# Patient Record
Sex: Male | Born: 1960 | Race: White | Hispanic: No | Marital: Married
Health system: Midwestern US, Academic
[De-identification: ages and names within clinical notes are randomized; demographics above are authoritative.]

## PROBLEM LIST (undated history)

## (undated) DIAGNOSIS — M545 Low back pain: ICD-10-CM

---

## 2016-09-22 ENCOUNTER — Encounter: Admit: 2016-09-22 | Discharge: 2016-09-22

## 2016-09-22 ENCOUNTER — Ambulatory Visit: Admit: 2016-09-22 | Discharge: 2016-09-23

## 2016-09-22 DIAGNOSIS — E119 Type 2 diabetes mellitus without complications: ICD-10-CM

## 2016-09-22 DIAGNOSIS — E785 Hyperlipidemia, unspecified: ICD-10-CM

## 2016-09-22 DIAGNOSIS — R69 Illness, unspecified: Principal | ICD-10-CM

## 2016-09-22 DIAGNOSIS — I10 Essential (primary) hypertension: Principal | ICD-10-CM

## 2016-09-22 MED ORDER — HYDROCODONE-ACETAMINOPHEN 10-325 MG PO TAB
1 | ORAL_TABLET | ORAL | 0 refills | 15.00000 days | Status: AC | PRN
Start: 2016-09-22 — End: 2016-09-22

## 2016-09-22 MED ORDER — BACLOFEN 10 MG PO TAB
10 mg | ORAL_TABLET | Freq: Two times a day (BID) | ORAL | 3 refills | Status: AC
Start: 2016-09-22 — End: 2016-10-07

## 2016-09-22 MED ORDER — HYDROCODONE-ACETAMINOPHEN 10-325 MG PO TAB
1 | ORAL_TABLET | ORAL | 0 refills | 30.00000 days | Status: AC | PRN
Start: 2016-09-22 — End: 2016-12-06

## 2016-09-22 MED ORDER — GABAPENTIN 800 MG PO TAB
800 mg | ORAL_TABLET | ORAL | 3 refills | Status: AC
Start: 2016-09-22 — End: 2016-10-07

## 2016-09-22 NOTE — Progress Notes
SPINE CENTER HISTORY AND PHYSICAL    Chief Complaint   Patient presents with   ??? Spine - Pain       Subjective     HISTORY OF PRESENT ILLNESS:   Ray Walls is 56 y.o. male who is kindly referred by Dr Felix Pacini at [facility].  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  He has been seen for pain management since 2017 status post L4-5 hemilaminectomy in 2015. The pain began without incident. He has received multiple rounds of transforaminal epidural steroid injections with good relief but limited duration.    Patient describes the pain as the following:  - Constant  - Localized to left sided back, buttocks, posterior thigh, and left lower leg behind the calf..  - Described as aching, numb-like, stabbing, tiring, nagging, shooting, burning, penetrating, throbbing, sharp, gnawing, and unbearable   - Pain is made better by: Rest, medications, heat pads  - Pain is made worse by: Walking, standing, extending back.  - Pain does radiate into the left leg and occasionally the right thigh.  - Reports numbness, weakness, and tingling in the left leg into the calf  - Patient denies bowel or bladder dysfunction.    - Pain is worse at night, he reports trouble sleeping at night as well.  - Pain score averages at 4-9/10 on VAS     Prior interventional pain management strategies: 5-6 TFESI at L3-4, and at L5-S1 - helps for about 2 weeks each time  Prior surgeries related to patient's pain: L4-5 Hemilaminectomy and bone spur removal    Patient is currently on the following modalities for pain management:  NSAIDs  Acetaminophen  Hydrocodone 5mg   Gabapentin  Voltaren gel  TENS unit        Past Medical History:   Diagnosis Date   ??? DM (diabetes mellitus) (HCC)    ??? Hyperlipidemia    ??? Hypertension    ??? Morbid obesity (HCC)        Past Surgical History:   Procedure Laterality Date   ??? LAMINECTOMY      Hemilaminectomy at L4-5       family history includes Heart Failure in his mother; Stroke in his father. Social History     Social History   ??? Marital status: Married     Spouse name: N/A   ??? Number of children: N/A   ??? Years of education: N/A     Occupational History   ???  [employer]     Social History Main Topics   ??? Smoking status: Never Smoker   ??? Smokeless tobacco: Never Used   ??? Alcohol use No   ??? Drug use: No   ??? Sexual activity: Not on file     Other Topics Concern   ??? Not on file     Social History Narrative   ??? No narrative on file       Allergies   Allergen Reactions   ??? Pcn [Penicillins] ANAPHYLAXIS       Current Outpatient Prescriptions on File Prior to Visit   Medication Sig Dispense Refill   ??? aspirin EC 81 mg tablet Take 81 mg by mouth daily.     ??? Blood-Glucose Meter (ONE TOUCH ULTRAMINI) kit Use to check blood glucose. 2 Each 0   ??? fenofibrate nanocrystallized (TRICOR) 48 mg tablet Take 48 mg by mouth daily.     ??? GLUC SU/CHONDRO SU A/VIT C/MN (GLUCOSAMINE 1500 COMPLEX  PO) Take 1 Tab by mouth daily.     ??? ibuprofen (MOTRIN) 200 mg tablet Take 400 mg by mouth twice daily as needed.     ??? insulin aspart (NOVOLOG) 100 unit/mL flexPEN 12 units plus correction; up to 50 U/day 3 box 3   ??? insulin glargine (LANTUS SOLOSTAR) 100 unit/mL (3 mL) injection PEN 44 U q hs 3 box 3   ??? ketoconazole (NIZORAL) 2 % topical cream Apply to affected area twice daily. Apply to both feet twice a day. 1 Container 1   ??? losartan/hydrochlorothiazide (HYZAAR)  50/12.5 mg tablet 1 Tab Take 1 Tab by mouth daily.     ??? metoprolol XL (TOPROL XL) 50 mg tablet Take 75 mg by mouth daily.     ??? oxyCODone-acetaminophen (PERCOCET) 5-325 mg tablet Take 1-2 Tabs by mouth every 4 hours as needed for Pain Earliest Fill Date: 01/31/12 Max 12 tabs/day 40 Tab 0   ??? senna (SENOKOT) 8.6 mg tablet Take 2 Tabs by mouth twice daily. 90 Tab    ??? sildenafil(+) (VIAGRA) 50 mg tablet Take 1 Tab by mouth as Needed for Erectile dysfunction. 10 Tab 0     No current facility-administered medications on file prior to visit.        Vitals: 09/22/16 0913   BP: 151/76   Pulse: 105   Resp: 15   SpO2: 99%   Weight: 122.9 kg (271 lb)   Height: 182.9 cm (72)       Oswestry Total Score:: 52    No Data Recorded  Is a controlled substance agreement on file?No    Pain Score: Nine    Body mass index is 36.75 kg/m???.    Review of Systems   Musculoskeletal: Positive for back pain.   All other systems reviewed and are negative.      PHYSICAL EXAM:   General: Alert, cooperative, no acute distress.  HEENT: Normocephalic, atraumatic.  Neck: Supple.  Lungs: Unlabored respirations, bilateral and equal chest excursion.  Heart: Regular rate by palpation of pulse.  Skin: Warm and dry to touch.  Abdomen: Nondistended, obese.    Musculoskeletal: Mild lumbar tenderness. POSITIVE tenderness of the LEFT SI joints. PAIN with lumbar extension. FABER negative bilaterally.    Neurological: Strength 5/5 in the bilateral lower extremities. Sensation to light touch is intact and equal in the bilateral lower extremities. Reflexes are 2+ in bilateral patellar and achilles tendons. Straight leg raise is POSITIVE ON LEFT.         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    IMPRESSION:    1. Failed back surgical syndrome    2. Lumbosacral radiculopathy    3. Facet arthropathy (HCC)    4. Sacroiliitis (HCC)          PLAN:   Ray Walls is a 55 y.o. male who presents for   Chief Complaint   Patient presents with   ??? Spine - Pain       1.  Lifestyle modification:  Recommend activity as tolerated. Encourage physical therapy and home exercise plan  2.  Medication: Will refill Norco 10/325 BID, Gabapentin 800mg  TID, and baclofen 10mg  BID  3.  Therapy: Refer to psychology for evaluation for  SCS trial 4.  Interventions: Pending psychologist evaluation, will schedule SCS trial with [employer] at next available appointment.  5.  Follow-up: 2 months    I was present for key elements of the history and physical and agree with the above note.  Thank you for the opportunity to participate in the care of Ray Walls, please do not hesitate to contact me with questions.

## 2016-09-23 DIAGNOSIS — M5417 Radiculopathy, lumbosacral region: ICD-10-CM

## 2016-09-23 DIAGNOSIS — M961 Postlaminectomy syndrome, not elsewhere classified: Principal | ICD-10-CM

## 2016-10-07 ENCOUNTER — Ambulatory Visit: Admit: 2016-10-07 | Discharge: 2016-10-08

## 2016-10-07 ENCOUNTER — Encounter: Admit: 2016-10-07 | Discharge: 2016-10-07

## 2016-10-07 DIAGNOSIS — E785 Hyperlipidemia, unspecified: ICD-10-CM

## 2016-10-07 DIAGNOSIS — M5417 Radiculopathy, lumbosacral region: Secondary | ICD-10-CM

## 2016-10-07 DIAGNOSIS — E119 Type 2 diabetes mellitus without complications: ICD-10-CM

## 2016-10-07 DIAGNOSIS — M5416 Radiculopathy, lumbar region: Principal | ICD-10-CM

## 2016-10-07 DIAGNOSIS — I10 Essential (primary) hypertension: Principal | ICD-10-CM

## 2016-10-07 MED ORDER — TRIAMCINOLONE ACETONIDE 40 MG/ML IJ SUSP
80 mg | Freq: Once | EPIDURAL | 0 refills | Status: CP
Start: 2016-10-07 — End: ?
  Administered 2016-10-07: 21:00:00 80 mg via EPIDURAL

## 2016-10-07 MED ORDER — GABAPENTIN 800 MG PO TAB
800 mg | ORAL_TABLET | ORAL | 3 refills | Status: AC
Start: 2016-10-07 — End: 2017-09-05

## 2016-10-07 MED ORDER — VOLTAREN 1 % TP GEL
Freq: Four times a day (QID) | TOPICAL | 5 refills | 30.00000 days | Status: AC
Start: 2016-10-07 — End: 2017-02-24

## 2016-10-07 MED ORDER — FENTANYL CITRATE (PF) 50 MCG/ML IJ SOLN
50 ug | Freq: Once | INTRAVENOUS | 0 refills | Status: CP
Start: 2016-10-07 — End: ?
  Administered 2016-10-07: 21:00:00 50 ug via INTRAVENOUS

## 2016-10-07 MED ORDER — IOPAMIDOL 41 % IT SOLN
2.5 mL | Freq: Once | EPIDURAL | 0 refills | Status: CP
Start: 2016-10-07 — End: ?
  Administered 2016-10-07: 21:00:00 2.5 mL via EPIDURAL

## 2016-10-07 MED ORDER — MIDAZOLAM 1 MG/ML IJ SOLN
2 mg | Freq: Once | INTRAVENOUS | 0 refills | Status: CP
Start: 2016-10-07 — End: ?
  Administered 2016-10-07: 21:00:00 2 mg via INTRAVENOUS

## 2016-10-07 MED ORDER — BACLOFEN 10 MG PO TAB
10 mg | ORAL_TABLET | Freq: Two times a day (BID) | ORAL | 3 refills | Status: AC
Start: 2016-10-07 — End: 2017-02-24

## 2016-10-07 NOTE — Progress Notes
SPINE CENTER  INTERVENTIONAL PAIN PROCEDURE HISTORY AND PHYSICAL    Chief Complaint   Patient presents with   ??? Lower Back - Pain       HISTORY OF PRESENT ILLNESS:  Bilateral lumbar pain, Mr. Jezewski last injection in [place] resulted in reduction of pain by more than 50% for 2-3 months.    Past Medical History:   Diagnosis Date   ??? DM (diabetes mellitus) (HCC)    ??? Hyperlipidemia    ??? Hypertension    ??? Morbid obesity (HCC)        Past Surgical History:   Procedure Laterality Date   ??? LAMINECTOMY      Hemilaminectomy at L4-5       family history includes Heart Failure in his mother; Stroke in his father.    Social History     Social History   ??? Marital status: Married     Spouse name: N/A   ??? Number of children: N/A   ??? Years of education: N/A     Occupational History   ???  [employer]     Social History Main Topics   ??? Smoking status: Never Smoker   ??? Smokeless tobacco: Never Used   ??? Alcohol use No   ??? Drug use: No   ??? Sexual activity: Not on file     Other Topics Concern   ??? Not on file     Social History Narrative   ??? No narrative on file       Allergies   Allergen Reactions   ??? Pcn [Penicillins] ANAPHYLAXIS       Vitals:    10/07/16 1444   BP: 127/73   Pulse: 93   Resp: 18   Temp: 36.8 ???C (98.3 ???F)   TempSrc: Oral   SpO2: 99%   Weight: 118.4 kg (261 lb)   Height: 182.9 cm (72)       REVIEW OF SYSTEMS: 10 point ROS obtained and negative except for back and leg pain.      PHYSICAL EXAM:  General: Alert and oriented, very pleasant male.   HEENT showed extraocular muscles were intact and no other abnormalities.  Unlabored breathing.  Regular rate and rhythm on CV exam.   5/5 strength in bilateral upper and lower extremities.    Sensation is intact to light touch and equal in the upper and lower extremities.  Bilateral lumbar tenderness to palpation        IMPRESSION:    1. Failed back surgical syndrome    2. Lumbosacral radiculopathy PLAN: Lumbar Transforaminal Steroid Injection at bilateral L5-S1    General Pre Procedural Sedation ASA Classification      I have discussed risks and alternatives of this type of sedation and procedure with: patient    NPO Status:Acceptable    Pregnancy Status: No    Prior Anesthetic Types: General, Deep sedation, Moderate sedation and Anxiolysis    Patient's had previous experience with anesthesia and/or sedation complications: No    Family history of sedation complications: No    Airway: Airway assessment performed II (soft palate, uvula, fauces visible)    Head and Neck: No abnormalities noted    Mouth: No abnormalities noted    Medications for Procedural Sedation: Midazolam and Fentanyl    Anesthesia Classification:  ASA II (A normal patient with mild systemic disease)    Patient remains a candidate for procedure: Yes    The intention for the procedure today is Anxiolysis/Analgesia.

## 2016-10-07 NOTE — Progress Notes
1535: Sedation physician present in room.  Recent vitals and patient condition reviewed between sedating physician and nurse.  Reassessment completed.  Determination made to proceed with planned sedation.

## 2016-10-07 NOTE — Procedures
Attending Surgeon: Gabriel RungEdward B Merrik Puebla, MD    Anesthesia: Local    Pre-Procedure Diagnosis:   1. Failed back surgical syndrome    2. Lumbosacral radiculopathy        Post-Procedure Diagnosis:   1. Failed back surgical syndrome    2. Lumbosacral radiculopathy        SNR/TF LMBR/SAC  Procedure: transforaminal epidural    Laterality: bilateral  Location: lumbar - L4-5      Consent:   Consent obtained: written  Consent given by: patient  Risks discussed: allergic reaction, bleeding, infection, weakness and no change or worsening in pain  Alternatives discussed: alternative treatment     Universal Protocol:  Relevant documents: relevant documents present and verified  Test results: test results available and properly labeled  Imaging studies: imaging studies available  Required items: required blood products, implants, devices, and special equipment available  Site marked: the operative site was marked  Patient identity confirmed: Patient identify confirmed verbally with patient.      Time out: Immediately prior to procedure a "time out" was called to verify the correct patient, procedure, equipment, support staff and site/side marked as required      Procedures Details:   Indications: pain   Prep: chlorhexidine  Patient position: prone  Estimated Blood Loss: minimal  Specimens: none  Number of Levels: 1  Guidance: fluoroscopy  Needle size: 25 G  Injection procedure: Negative aspiration for blood  Patient tolerance: Patient tolerated the procedure well with no immediate complications. Pressure was applied, and hemostasis was accomplished.  Outcome: Pain improved  Comments: Kenalog 40 mg was injected at each location      Estimated blood loss: none or minimal  Specimens: none  Patient tolerated the procedure well with no immediate complications. Pressure was applied, and hemostasis was accomplished.

## 2016-10-07 NOTE — Progress Notes
Ray Walls [phone] will be responsible for picking the patient up following today's procedure. Pt states the driver is aware he's supposed to come and pick him up.

## 2016-10-08 ENCOUNTER — Ambulatory Visit: Admit: 2016-10-07 | Discharge: 2016-10-08

## 2016-10-08 DIAGNOSIS — E785 Hyperlipidemia, unspecified: ICD-10-CM

## 2016-10-08 DIAGNOSIS — E119 Type 2 diabetes mellitus without complications: ICD-10-CM

## 2016-10-08 DIAGNOSIS — M5417 Radiculopathy, lumbosacral region: ICD-10-CM

## 2016-10-08 DIAGNOSIS — I10 Essential (primary) hypertension: ICD-10-CM

## 2016-10-08 DIAGNOSIS — Z880 Allergy status to penicillin: ICD-10-CM

## 2016-10-08 DIAGNOSIS — M961 Postlaminectomy syndrome, not elsewhere classified: Principal | ICD-10-CM

## 2016-10-08 DIAGNOSIS — M5416 Radiculopathy, lumbar region: ICD-10-CM

## 2016-10-29 ENCOUNTER — Encounter: Admit: 2016-10-29 | Discharge: 2016-10-29

## 2016-10-29 NOTE — Telephone Encounter
Spoke with Ray Walls today. He was very upset about surgery date. He would like a sooner day? He says he has to plan his schedule around [name]. He is also very upset about the injection received 8/2  Statesdid not work at all.  Patient requesting recommendations until date of stim trial?

## 2016-11-12 ENCOUNTER — Ambulatory Visit: Admit: 2016-11-12 | Discharge: 2016-11-13

## 2016-11-12 ENCOUNTER — Encounter: Admit: 2016-11-12 | Discharge: 2016-11-12

## 2016-11-12 DIAGNOSIS — M5416 Radiculopathy, lumbar region: Principal | ICD-10-CM

## 2016-11-17 ENCOUNTER — Encounter: Admit: 2016-11-17 | Discharge: 2016-11-18

## 2016-11-17 ENCOUNTER — Ambulatory Visit: Admit: 2016-11-17 | Discharge: 2016-11-18

## 2016-11-17 ENCOUNTER — Encounter: Admit: 2016-11-17 | Discharge: 2016-11-17

## 2016-11-17 DIAGNOSIS — M5416 Radiculopathy, lumbar region: ICD-10-CM

## 2016-11-17 DIAGNOSIS — M961 Postlaminectomy syndrome, not elsewhere classified: Principal | ICD-10-CM

## 2016-11-17 DIAGNOSIS — E119 Type 2 diabetes mellitus without complications: ICD-10-CM

## 2016-11-17 DIAGNOSIS — I10 Essential (primary) hypertension: Principal | ICD-10-CM

## 2016-11-17 DIAGNOSIS — E785 Hyperlipidemia, unspecified: ICD-10-CM

## 2016-11-17 NOTE — Progress Notes
SPINE CENTER CLINIC NOTE  Subjective     SUBJECTIVE: Pt is a 56 year old male with lower back pain who underwent an abbott trial on Friday.  He is doing well.  His pain has decreased by greater than 50%.  He reports that the pain control is slightly better on the right than the left.  He is happy with the results and looking forward to the implant.          Review of Systems   All other systems reviewed and are negative.      Current Outpatient Prescriptions:   ???  aspirin EC 81 mg tablet, Take 81 mg by mouth daily., Disp: , Rfl:   ???  baclofen (LIORESAL) 10 mg tablet, Take 1 tablet by mouth twice daily., Disp: 60 tablet, Rfl: 3  ???  Blood-Glucose Meter (ONE TOUCH ULTRAMINI) kit, Use to check blood glucose., Disp: 2 Each, Rfl: 0  ???  fenofibrate nanocrystallized (TRICOR) 48 mg tablet, Take 48 mg by mouth daily., Disp: , Rfl:   ???  gabapentin (NEURONTIN) 800 mg tablet, Take 1 tablet by mouth every 8 hours., Disp: 90 tablet, Rfl: 3  ???  GLUC SU/CHONDRO SU A/VIT C/MN (GLUCOSAMINE 1500 COMPLEX PO), Take 1 Tab by mouth daily., Disp: , Rfl:   ???  HYDROcodone/acetaminophen(+) (NORCO) 10/325 mg tablet, Take 1 tablet by mouth every 6 hours as needed for Pain, Disp: 120 tablet, Rfl: 0  ???  ibuprofen (MOTRIN) 200 mg tablet, Take 400 mg by mouth twice daily as needed., Disp: , Rfl:   ???  insulin aspart (NOVOLOG) 100 unit/mL flexPEN, 12 units plus correction; up to 50 U/day, Disp: 3 box, Rfl: 3  ???  insulin glargine (LANTUS SOLOSTAR) 100 unit/mL (3 mL) injection PEN, 44 U q hs, Disp: 3 box, Rfl: 3  ???  ketoconazole (NIZORAL) 2 % topical cream, Apply to affected area twice daily. Apply to both feet twice a day., Disp: 1 Container, Rfl: 1  ???  losartan/hydrochlorothiazide (HYZAAR)  50/12.5 mg tablet 1 Tab, Take 1 Tab by mouth daily., Disp: , Rfl:   ???  metoprolol XL (TOPROL XL) 50 mg tablet, Take 75 mg by mouth daily., Disp: , Rfl:   ???  oxyCODone-acetaminophen (PERCOCET) 5-325 mg tablet, Take 1-2 Tabs by mouth every 4 hours as needed for Pain Earliest Fill Date: 01/31/12 Max 12 tabs/day, Disp: 40 Tab, Rfl: 0  ???  senna (SENOKOT) 8.6 mg tablet, Take 2 Tabs by mouth twice daily., Disp: 90 Tab, Rfl:   ???  sildenafil(+) (VIAGRA) 50 mg tablet, Take 1 Tab by mouth as Needed for Erectile dysfunction., Disp: 10 Tab, Rfl: 0  ???  VOLTAREN 1 % topical gel, Apply  topically to affected area four times daily., Disp: 300 g, Rfl: 5  Allergies   Allergen Reactions   ??? Pcn [Penicillins] ANAPHYLAXIS     Physical Exam  There were no vitals filed for this visit.        There is no height or weight on file to calculate BMI.    Physical Exam:  General: Alert, cooperative, no acute distress.  HEENT: Normocephalic, atraumatic.  Neck: Supple.  Lungs: Unlabored respirations, bilateral and equal chest excursion.  Heart: Regular rate by palpation of pulse.  Skin: Warm and dry to touch.  Abdomen: Nondistended.    Musculoskeletal:  Normal gait    Neurological: AOx3           IMPRESSION:  1. Failed back surgical syndrome    2.  Lumbar radiculopathy    3. Lumbosacral radiculopathy    4. Facet arthropathy    5. Sacroiliitis [facility])          PLAN:  Implant [employer] System on 21st of September.    I was present for key elements of the history and physical and agree with the above note.  Thank you for the opportunity to participate in the care of Ray Walls, please do not hesitate to contact me with questions.

## 2016-11-18 DIAGNOSIS — M961 Postlaminectomy syndrome, not elsewhere classified: Principal | ICD-10-CM

## 2016-11-19 LAB — MRSA SCREEN

## 2016-11-26 ENCOUNTER — Encounter: Admit: 2016-11-26 | Discharge: 2016-11-26

## 2016-11-26 ENCOUNTER — Ambulatory Visit: Admit: 2016-11-26 | Discharge: 2016-11-26

## 2016-11-26 DIAGNOSIS — M5416 Radiculopathy, lumbar region: Principal | ICD-10-CM

## 2016-12-01 ENCOUNTER — Encounter: Admit: 2016-12-01 | Discharge: 2016-12-01

## 2016-12-01 DIAGNOSIS — E785 Hyperlipidemia, unspecified: ICD-10-CM

## 2016-12-01 DIAGNOSIS — E119 Type 2 diabetes mellitus without complications: ICD-10-CM

## 2016-12-01 DIAGNOSIS — I10 Essential (primary) hypertension: Principal | ICD-10-CM

## 2016-12-02 ENCOUNTER — Ambulatory Visit: Admit: 2016-12-01 | Discharge: 2016-12-02

## 2016-12-02 DIAGNOSIS — M5116 Intervertebral disc disorders with radiculopathy, lumbar region: Principal | ICD-10-CM

## 2016-12-06 ENCOUNTER — Encounter: Admit: 2016-12-06 | Discharge: 2016-12-06

## 2016-12-06 ENCOUNTER — Ambulatory Visit: Admit: 2016-12-06 | Discharge: 2016-12-07

## 2016-12-06 DIAGNOSIS — E785 Hyperlipidemia, unspecified: ICD-10-CM

## 2016-12-06 DIAGNOSIS — I10 Essential (primary) hypertension: Principal | ICD-10-CM

## 2016-12-06 DIAGNOSIS — E119 Type 2 diabetes mellitus without complications: ICD-10-CM

## 2016-12-06 MED ORDER — HYDROCODONE-ACETAMINOPHEN 10-325 MG PO TAB
1 | ORAL_TABLET | ORAL | 0 refills | 15.00000 days | Status: AC | PRN
Start: 2016-12-06 — End: 2016-12-20

## 2016-12-06 NOTE — Progress Notes
SPINE CENTER CLINIC NOTE  Subjective     SUBJECTIVE: Mr. Ray Walls presents in follow up for ongoing care regarding back and lower extremity pain.  Pain is more than 50% improved following spinal cord stimulator placement.  His complaint today is left thigh pain.         Review of Systems   Constitutional: Negative.    HENT: Negative.    Eyes: Negative.    Respiratory: Negative.    Cardiovascular: Negative.    Gastrointestinal: Negative.    Endocrine: Negative.    Genitourinary: Negative.    Musculoskeletal: Negative.    Skin: Negative.    Allergic/Immunologic: Negative.    Neurological: Negative.    Hematological: Negative.    Psychiatric/Behavioral: Negative.    All other systems reviewed and are negative.      Current Outpatient Prescriptions:   ???  aspirin EC 81 mg tablet, Take 81 mg by mouth daily., Disp: , Rfl:   ???  baclofen (LIORESAL) 10 mg tablet, Take 1 tablet by mouth twice daily., Disp: 60 tablet, Rfl: 3  ???  Blood-Glucose Meter (ONE TOUCH ULTRAMINI) kit, Use to check blood glucose., Disp: 2 Each, Rfl: 0  ???  fenofibrate nanocrystallized (TRICOR) 48 mg tablet, Take 48 mg by mouth daily., Disp: , Rfl:   ???  gabapentin (NEURONTIN) 800 mg tablet, Take 1 tablet by mouth every 8 hours., Disp: 90 tablet, Rfl: 3  ???  GLUC SU/CHONDRO SU A/VIT C/MN (GLUCOSAMINE 1500 COMPLEX PO), Take 1 Tab by mouth daily., Disp: , Rfl:   ???  HYDROcodone/acetaminophen(+) (NORCO) 10/325 mg tablet, Take 1 tablet by mouth every 6 hours as needed for Pain, Disp: 120 tablet, Rfl: 0  ???  ibuprofen (MOTRIN) 200 mg tablet, Take 400 mg by mouth twice daily as needed., Disp: , Rfl:   ???  insulin aspart (NOVOLOG) 100 unit/mL flexPEN, 12 units plus correction; up to 50 U/day, Disp: 3 box, Rfl: 3  ???  insulin glargine (LANTUS SOLOSTAR) 100 unit/mL (3 mL) injection PEN, 44 U q hs, Disp: 3 box, Rfl: 3  ???  ketoconazole (NIZORAL) 2 % topical cream, Apply to affected area twice daily. Apply to both feet twice a day., Disp: 1 Container, Rfl: 1 ???  losartan/hydrochlorothiazide (HYZAAR)  50/12.5 mg tablet 1 Tab, Take 1 Tab by mouth daily., Disp: , Rfl:   ???  metoprolol XL (TOPROL XL) 50 mg tablet, Take 75 mg by mouth daily., Disp: , Rfl:   ???  oxyCODone-acetaminophen (PERCOCET) 5-325 mg tablet, Take 1-2 Tabs by mouth every 4 hours as needed for Pain Earliest Fill Date: 01/31/12 Max 12 tabs/day, Disp: 40 Tab, Rfl: 0  ???  senna (SENOKOT) 8.6 mg tablet, Take 2 Tabs by mouth twice daily., Disp: 90 Tab, Rfl:   ???  sildenafil(+) (VIAGRA) 50 mg tablet, Take 1 Tab by mouth as Needed for Erectile dysfunction., Disp: 10 Tab, Rfl: 0  ???  VOLTAREN 1 % topical gel, Apply  topically to affected area four times daily., Disp: 300 g, Rfl: 5  Allergies   Allergen Reactions   ??? Pcn [Penicillins] ANAPHYLAXIS     Physical Exam  Vitals:    12/06/16 1510   BP: 134/80   Pulse: 99   Weight: 120.2 kg (265 lb)   Height: 182.9 cm (72)        Pain Score: Zero  Body mass index is 35.94 kg/m???.  General: Alert and oriented, very pleasant male.   HEENT showed extraocular muscles were intact and no other  abnormalities.  Unlabored breathing.  Regular rate and rhythm on CV exam.   5/5 strength in bilateral upper and lower extremities.    Sensation is intact to light touch and equal in the upper and lower extremities.  Sutures were removed on exam and steri strips placed over incisions       IMPRESSION:  1. Lumbar disc disease with radiculopathy    2. Lumbar radicular pain          PLAN:  Will schedule a 2 week follow up for ongoing care regarding back and leg pain.

## 2016-12-07 DIAGNOSIS — M5116 Intervertebral disc disorders with radiculopathy, lumbar region: Principal | ICD-10-CM

## 2016-12-20 ENCOUNTER — Ambulatory Visit: Admit: 2016-12-20 | Discharge: 2016-12-21

## 2016-12-20 ENCOUNTER — Encounter: Admit: 2016-12-20 | Discharge: 2016-12-20

## 2016-12-20 DIAGNOSIS — E119 Type 2 diabetes mellitus without complications: ICD-10-CM

## 2016-12-20 DIAGNOSIS — M5416 Radiculopathy, lumbar region: ICD-10-CM

## 2016-12-20 DIAGNOSIS — E785 Hyperlipidemia, unspecified: ICD-10-CM

## 2016-12-20 DIAGNOSIS — I10 Essential (primary) hypertension: Principal | ICD-10-CM

## 2016-12-20 MED ORDER — HYDROCODONE-ACETAMINOPHEN 10-325 MG PO TAB
1 | ORAL_TABLET | ORAL | 0 refills | 30.00000 days | Status: AC | PRN
Start: 2016-12-20 — End: 2017-02-09

## 2016-12-20 NOTE — Progress Notes
SPINE CENTER CLINIC NOTE  Subjective     SUBJECTIVE: Mr. Ray Walls presents for ongoing care regarding back and leg pain.  Pain is mainly left sided and intermittently sharp and dull.  The stimulator is doing a good job resolving pain but is causing left leg cramping.  Hydrocodone/apap 10/325 mg po q6 hrs prn takes the edge off his pain.         Review of Systems   Constitutional: Negative.    HENT: Negative.    Eyes: Negative.    Respiratory: Negative.    Cardiovascular: Negative.    Gastrointestinal: Negative.    Endocrine: Negative.    Genitourinary: Negative.    Musculoskeletal: Negative.    Skin: Negative.    Allergic/Immunologic: Negative.    Neurological: Negative.    Hematological: Negative.    Psychiatric/Behavioral: Negative.    All other systems reviewed and are negative.      Current Outpatient Prescriptions:   ???  aspirin EC 81 mg tablet, Take 81 mg by mouth daily., Disp: , Rfl:   ???  baclofen (LIORESAL) 10 mg tablet, Take 1 tablet by mouth twice daily., Disp: 60 tablet, Rfl: 3  ???  Blood-Glucose Meter (ONE TOUCH ULTRAMINI) kit, Use to check blood glucose., Disp: 2 Each, Rfl: 0  ???  fenofibrate nanocrystallized (TRICOR) 48 mg tablet, Take 48 mg by mouth daily., Disp: , Rfl:   ???  gabapentin (NEURONTIN) 800 mg tablet, Take 1 tablet by mouth every 8 hours., Disp: 90 tablet, Rfl: 3  ???  GLUC SU/CHONDRO SU A/VIT C/MN (GLUCOSAMINE 1500 COMPLEX PO), Take 1 Tab by mouth daily., Disp: , Rfl:   ???  HYDROcodone/acetaminophen(+) (NORCO) 10/325 mg tablet, Take one tablet by mouth every 6 hours as needed for Pain, Disp: 120 tablet, Rfl: 0  ???  ibuprofen (MOTRIN) 200 mg tablet, Take 400 mg by mouth twice daily as needed., Disp: , Rfl:   ???  insulin aspart (NOVOLOG) 100 unit/mL flexPEN, 12 units plus correction; up to 50 U/day, Disp: 3 box, Rfl: 3  ???  insulin glargine (LANTUS SOLOSTAR) 100 unit/mL (3 mL) injection PEN, 44 U q hs, Disp: 3 box, Rfl: 3  ???  ketoconazole (NIZORAL) 2 % topical cream, Apply to affected area twice daily. Apply to both feet twice a day., Disp: 1 Container, Rfl: 1  ???  losartan/hydrochlorothiazide (HYZAAR)  50/12.5 mg tablet 1 Tab, Take 1 Tab by mouth daily., Disp: , Rfl:   ???  metoprolol XL (TOPROL XL) 50 mg tablet, Take 75 mg by mouth daily., Disp: , Rfl:   ???  oxyCODone-acetaminophen (PERCOCET) 5-325 mg tablet, Take 1-2 Tabs by mouth every 4 hours as needed for Pain Earliest Fill Date: 01/31/12 Max 12 tabs/day, Disp: 40 Tab, Rfl: 0  ???  senna (SENOKOT) 8.6 mg tablet, Take 2 Tabs by mouth twice daily., Disp: 90 Tab, Rfl:   ???  sildenafil(+) (VIAGRA) 50 mg tablet, Take 1 Tab by mouth as Needed for Erectile dysfunction., Disp: 10 Tab, Rfl: 0  ???  VOLTAREN 1 % topical gel, Apply  topically to affected area four times daily., Disp: 300 g, Rfl: 5  Allergies   Allergen Reactions   ??? Pcn [Penicillins] ANAPHYLAXIS     Physical Exam  Vitals:    12/20/16 1449   BP: 138/78   Pulse: 83   Weight: 120.2 kg (265 lb)   Height: 182.9 cm (72)        Pain Score: Four  Body mass index is 35.94 kg/m???.  General: Alert and oriented, very pleasant male.   HEENT showed extraocular muscles were intact and no other abnormalities.  Unlabored breathing.  Regular rate and rhythm on CV exam.   5/5 strength in bilateral upper and lower extremities.    Sensation is intact to light touch and equal in the upper and lower extremities.  Left lumbar tenderness to palpation       IMPRESSION:  1. Lumbar disc disease with radiculopathy    2. Lumbar radicular pain          PLAN:  Will refill hydrocodone and schedule a 1 month follow up.  His spinal cord stimuator was adjusted in conjunction with an [employer] representative.

## 2016-12-21 DIAGNOSIS — Z880 Allergy status to penicillin: ICD-10-CM

## 2016-12-21 DIAGNOSIS — Z794 Long term (current) use of insulin: ICD-10-CM

## 2016-12-21 DIAGNOSIS — M5116 Intervertebral disc disorders with radiculopathy, lumbar region: Principal | ICD-10-CM

## 2016-12-21 DIAGNOSIS — E119 Type 2 diabetes mellitus without complications: ICD-10-CM

## 2016-12-21 DIAGNOSIS — Z7982 Long term (current) use of aspirin: ICD-10-CM

## 2016-12-21 DIAGNOSIS — Z9689 Presence of other specified functional implants: ICD-10-CM

## 2017-02-09 ENCOUNTER — Encounter: Admit: 2017-02-09 | Discharge: 2017-02-09

## 2017-02-09 ENCOUNTER — Ambulatory Visit: Admit: 2017-02-09 | Discharge: 2017-02-10

## 2017-02-09 DIAGNOSIS — I10 Essential (primary) hypertension: Principal | ICD-10-CM

## 2017-02-09 DIAGNOSIS — E785 Hyperlipidemia, unspecified: ICD-10-CM

## 2017-02-09 DIAGNOSIS — E119 Type 2 diabetes mellitus without complications: ICD-10-CM

## 2017-02-09 MED ORDER — HYDROCODONE-ACETAMINOPHEN 10-325 MG PO TAB
1 | ORAL_TABLET | ORAL | 0 refills | 30.00000 days | Status: AC | PRN
Start: 2017-02-09 — End: 2017-02-24

## 2017-02-09 MED ORDER — HYDROCODONE-ACETAMINOPHEN 10-325 MG PO TAB
1 | ORAL_TABLET | ORAL | 0 refills | 15.00000 days | Status: AC | PRN
Start: 2017-02-09 — End: 2017-02-24

## 2017-02-09 NOTE — Progress Notes
[facility] CLINIC NOTE  Subjective     SUBJECTIVE: Mr. Ray Walls presents in follow-up for ongoing care regarding back and exam pain.  Pain is in the bilateral lumbar lumbar region described as intermittently sharp and dull.  Pain is mainly left-sided and he underwent a spinal cord similar adjustment today in conjunction with an avid representative.  Walking standing bending exacerbate the pain.  Hydrocodone takes the edge off the pain and he is taking about 410/3 125 mg tablets per day.         Review of Systems   All other systems reviewed and are negative.      Current Outpatient Prescriptions:   ???  aspirin EC 81 mg tablet, Take 81 mg by mouth daily., Disp: , Rfl:   ???  baclofen (LIORESAL) 10 mg tablet, Take 1 tablet by mouth twice daily., Disp: 60 tablet, Rfl: 3  ???  Blood-Glucose Meter (ONE TOUCH ULTRAMINI) kit, Use to check blood glucose., Disp: 2 Each, Rfl: 0  ???  fenofibrate nanocrystallized (TRICOR) 48 mg tablet, Take 48 mg by mouth daily., Disp: , Rfl:   ???  gabapentin (NEURONTIN) 800 mg tablet, Take 1 tablet by mouth every 8 hours., Disp: 90 tablet, Rfl: 3  ???  GLUC SU/CHONDRO SU A/VIT C/MN (GLUCOSAMINE 1500 COMPLEX PO), Take 1 Tab by mouth daily., Disp: , Rfl:   ???  HYDROcodone/acetaminophen(+) (NORCO) 10/325 mg tablet, Take one tablet by mouth every 6 hours as needed for Pain, Disp: 120 tablet, Rfl: 0  ???  [START ON 03/12/2017] HYDROcodone/acetaminophen(+) (NORCO) 10/325 mg tablet, Take one tablet by mouth every 6 hours as needed for Pain Earliest Fill Date: 03/12/17, Disp: 120 tablet, Rfl: 0  ???  ibuprofen (MOTRIN) 200 mg tablet, Take 400 mg by mouth twice daily as needed., Disp: , Rfl:   ???  insulin aspart (NOVOLOG) 100 unit/mL flexPEN, 12 units plus correction; up to 50 U/day, Disp: 3 box, Rfl: 3  ???  insulin glargine (LANTUS SOLOSTAR) 100 unit/mL (3 mL) injection PEN, 44 U q hs, Disp: 3 box, Rfl: 3  ???  ketoconazole (NIZORAL) 2 % topical cream, Apply to affected area twice daily. Apply to both feet twice a day., Disp: 1 Container, Rfl: 1  ???  losartan/hydrochlorothiazide (HYZAAR)  50/12.5 mg tablet 1 Tab, Take 1 Tab by mouth daily., Disp: , Rfl:   ???  metoprolol XL (TOPROL XL) 50 mg tablet, Take 75 mg by mouth daily., Disp: , Rfl:   ???  oxyCODone-acetaminophen (PERCOCET) 5-325 mg tablet, Take 1-2 Tabs by mouth every 4 hours as needed for Pain Earliest Fill Date: 01/31/12 Max 12 tabs/day, Disp: 40 Tab, Rfl: 0  ???  senna (SENOKOT) 8.6 mg tablet, Take 2 Tabs by mouth twice daily., Disp: 90 Tab, Rfl:   ???  sildenafil(+) (VIAGRA) 50 mg tablet, Take 1 Tab by mouth as Needed for Erectile dysfunction., Disp: 10 Tab, Rfl: 0  ???  VOLTAREN 1 % topical gel, Apply  topically to affected area four times daily., Disp: 300 g, Rfl: 5  Allergies   Allergen Reactions   ??? Pcn [Penicillins] ANAPHYLAXIS     Physical Exam  Vitals:    02/09/17 1415   BP: (!) 147/99   Pulse: 81   Resp: 22   Temp: 36.7 ???C (98 ???F)   TempSrc: Oral   SpO2: 98%   Weight: 120.2 kg (265 lb)   Height: 182.9 cm (72)        Pain Score: Nine  Body mass index  is 35.94 kg/m???.  General: Alert and oriented, very pleasant male.   HEENT showed extraocular muscles were intact and no other abnormalities.  Unlabored breathing.  Regular rate and rhythm on CV exam.   5/5 strength in bilateral upper and lower extremities.    Sensation is intact to light touch and equal in the upper and lower extremities.  There is left lumbar tenderness to palpation and straight leg raise is positive on the left.  There is tenderness in the lateral thigh.       IMPRESSION:  1. Lumbar disc disease with radiculopathy    2. Lumbar radicular pain          PLAN: Will schedule a left L4-5 and L5-S1 transfemoral epidural steroid injection and provided a refill for hydrocodone as described above.

## 2017-02-10 DIAGNOSIS — M5416 Radiculopathy, lumbar region: ICD-10-CM

## 2017-02-10 DIAGNOSIS — M5116 Intervertebral disc disorders with radiculopathy, lumbar region: Principal | ICD-10-CM

## 2017-02-24 ENCOUNTER — Encounter: Admit: 2017-02-24 | Discharge: 2017-02-24

## 2017-02-24 DIAGNOSIS — M5416 Radiculopathy, lumbar region: ICD-10-CM

## 2017-02-24 DIAGNOSIS — I10 Essential (primary) hypertension: Principal | ICD-10-CM

## 2017-02-24 DIAGNOSIS — E119 Type 2 diabetes mellitus without complications: ICD-10-CM

## 2017-02-24 DIAGNOSIS — M5116 Intervertebral disc disorders with radiculopathy, lumbar region: Principal | ICD-10-CM

## 2017-02-24 DIAGNOSIS — E785 Hyperlipidemia, unspecified: ICD-10-CM

## 2017-02-24 MED ORDER — BACLOFEN 10 MG PO TAB
10 mg | ORAL_TABLET | Freq: Two times a day (BID) | ORAL | 3 refills | Status: AC
Start: 2017-02-24 — End: 2017-04-29

## 2017-02-24 MED ORDER — HYDROCODONE-ACETAMINOPHEN 10-325 MG PO TAB
1 | ORAL_TABLET | ORAL | 0 refills | 30.00000 days | Status: AC | PRN
Start: 2017-02-24 — End: 2017-06-27

## 2017-02-24 MED ORDER — VOLTAREN 1 % TP GEL
Freq: Four times a day (QID) | TOPICAL | 5 refills | 60.00000 days | Status: AC
Start: 2017-02-24 — End: 2017-04-29

## 2017-02-24 MED ORDER — HYDROCODONE-ACETAMINOPHEN 10-325 MG PO TAB
1 | ORAL_TABLET | ORAL | 0 refills | 30.00000 days | Status: AC | PRN
Start: 2017-02-24 — End: 2017-04-29

## 2017-02-24 MED ORDER — IOPAMIDOL 41 % IT SOLN
2.5 mL | Freq: Once | EPIDURAL | 0 refills | Status: CP
Start: 2017-02-24 — End: ?
  Administered 2017-02-24: 21:00:00 2.5 mL via EPIDURAL

## 2017-02-24 MED ORDER — TRIAMCINOLONE ACETONIDE 40 MG/ML IJ SUSP
80 mg | Freq: Once | EPIDURAL | 0 refills | Status: CP
Start: 2017-02-24 — End: ?
  Administered 2017-02-24: 21:00:00 80 mg via EPIDURAL

## 2017-02-24 NOTE — Procedures
Attending Surgeon: Tyjae Issa B Abreanna Drawdy, MD    Anesthesia: Local    Pre-Procedure Diagnosis:   1. Lumbar disc disease with radiculopathy    2. Lumbar radicular pain        Post-Procedure Diagnosis:   1. Lumbar disc disease with radiculopathy    2. Lumbar radicular pain        SNR/TF LMBR/SAC  Procedure: transforaminal epidural    Laterality: left  Location: lumbar - L4-5 and L5-S1      Consent:   Consent obtained: written  Consent given by: patient  Risks discussed: allergic reaction, bleeding, infection, weakness and no change or worsening in pain  Alternatives discussed: alternative treatment     Universal Protocol:  Relevant documents: relevant documents present and verified  Test results: test results available and properly labeled  Imaging studies: imaging studies available  Required items: required blood products, implants, devices, and special equipment available  Site marked: the operative site was marked  Patient identity confirmed: Patient identify confirmed verbally with patient.      Time out: Immediately prior to procedure a "time out" was called to verify the correct patient, procedure, equipment, support staff and site/side marked as required      Procedures Details:   Indications: pain   Prep: chlorhexidine  Patient position: prone  Estimated Blood Loss: minimal  Specimens: none  Number of Levels: 2  Approach: left paramedian  Guidance: fluoroscopy  Needle size: 25 G  Injection procedure: Negative aspiration for blood  Patient tolerance: Patient tolerated the procedure well with no immediate complications. Pressure was applied, and hemostasis was accomplished.  Outcome: Pain improved  Comments: Kenalog 40 mg was injected at each location      Estimated blood loss: none or minimal  Specimens: none  Patient tolerated the procedure well with no immediate complications. Pressure was applied, and hemostasis was accomplished.

## 2017-02-24 NOTE — Progress Notes
[facility]  INTERVENTIONAL PAIN PROCEDURE HISTORY AND PHYSICAL    Chief Complaint   Patient presents with   ??? Lower Back - Pain       HISTORY OF PRESENT ILLNESS:  Left lumbar radiculopathy, the last block helped more than 50% for 2-3 months    Past Medical History:   Diagnosis Date   ??? DM (diabetes mellitus) (HCC)    ??? Hyperlipidemia    ??? Hypertension    ??? Morbid obesity (HCC)        Past Surgical History:   Procedure Laterality Date   ??? LAMINECTOMY      Hemilaminectomy at L4-5       family history includes Heart Failure in his mother; Stroke in his father.    Social History     Socioeconomic History   ??? Marital status: Married     Spouse name: Not on file   ??? Number of children: Not on file   ??? Years of education: Not on file   ??? Highest education level: Not on file   Social Needs   ??? Financial resource strain: Not on file   ??? Food insecurity - worry: Not on file   ??? Food insecurity - inability: Not on file   ??? Transportation needs - medical: Not on file   ??? Transportation needs - non-medical: Not on file   Occupational History     Employer: [employer]   Tobacco Use   ??? Smoking status: Never Smoker   ??? Smokeless tobacco: Never Used   Substance and Sexual Activity   ??? Alcohol use: No   ??? Drug use: No   ??? Sexual activity: Not on file   Other Topics Concern   ??? Not on file   Social History Narrative   ??? Not on file       Allergies   Allergen Reactions   ??? Pcn [Penicillins] ANAPHYLAXIS       Vitals:    02/24/17 1344 02/24/17 1354   BP: 140/87 135/87   Pulse: 84    Temp: 37.3 ???C (99.1 ???F)    SpO2: 98%    Weight: 120.2 kg (265 lb)    Height: 182.9 cm (72)        REVIEW OF SYSTEMS: 10 point ROS obtained and negative except for back and leg pain      PHYSICAL EXAM:  General: Alert and oriented, very pleasant male.   HEENT showed extraocular muscles were intact and no other abnormalities.  Unlabored breathing.  Regular rate and rhythm on CV exam. 5/5 strength in bilateral upper and lower extremities.    Sensation is intact to light touch and equal in the upper and lower extremities.  Left lumbar tenderness to palpation      IMPRESSION:    1. Lumbar disc disease with radiculopathy    2. Lumbar radicular pain         PLAN: Lumbar Transforaminal Steroid Injection at left L4-5 and L5-S1

## 2017-02-25 ENCOUNTER — Ambulatory Visit: Admit: 2017-02-24 | Discharge: 2017-02-25 | Payer: Private Health Insurance - Indemnity

## 2017-02-25 ENCOUNTER — Ambulatory Visit: Admit: 2017-02-24 | Discharge: 2017-02-25

## 2017-02-25 DIAGNOSIS — E119 Type 2 diabetes mellitus without complications: ICD-10-CM

## 2017-02-25 DIAGNOSIS — E785 Hyperlipidemia, unspecified: ICD-10-CM

## 2017-02-25 DIAGNOSIS — Z880 Allergy status to penicillin: ICD-10-CM

## 2017-02-25 DIAGNOSIS — M5116 Intervertebral disc disorders with radiculopathy, lumbar region: Secondary | ICD-10-CM

## 2017-02-25 DIAGNOSIS — I10 Essential (primary) hypertension: ICD-10-CM

## 2017-04-18 ENCOUNTER — Encounter: Admit: 2017-04-18 | Discharge: 2017-04-18

## 2017-04-29 ENCOUNTER — Ambulatory Visit: Admit: 2017-04-29 | Discharge: 2017-04-30 | Payer: BC Managed Care – PPO

## 2017-04-29 ENCOUNTER — Encounter: Admit: 2017-04-29 | Discharge: 2017-04-29

## 2017-04-29 DIAGNOSIS — M5416 Radiculopathy, lumbar region: Principal | ICD-10-CM

## 2017-04-29 DIAGNOSIS — E785 Hyperlipidemia, unspecified: ICD-10-CM

## 2017-04-29 DIAGNOSIS — I10 Essential (primary) hypertension: Principal | ICD-10-CM

## 2017-04-29 DIAGNOSIS — E119 Type 2 diabetes mellitus without complications: ICD-10-CM

## 2017-04-29 MED ORDER — HYDROCODONE-ACETAMINOPHEN 10-325 MG PO TAB
1 | ORAL_TABLET | Freq: Two times a day (BID) | ORAL | 0 refills | 30.00000 days | Status: AC | PRN
Start: 2017-04-29 — End: ?

## 2017-04-29 MED ORDER — VOLTAREN 1 % TP GEL
2 g | Freq: Four times a day (QID) | TOPICAL | 3 refills | 30.00000 days | Status: AC
Start: 2017-04-29 — End: 2017-04-29

## 2017-04-29 MED ORDER — BACLOFEN 10 MG PO TAB
10 mg | ORAL_TABLET | Freq: Two times a day (BID) | ORAL | 3 refills | Status: AC
Start: 2017-04-29 — End: 2017-08-10

## 2017-04-29 MED ORDER — BACLOFEN 10 MG PO TAB
10 mg | ORAL_TABLET | Freq: Two times a day (BID) | ORAL | 3 refills | Status: AC
Start: 2017-04-29 — End: 2017-04-29

## 2017-04-29 MED ORDER — HYDROCODONE-ACETAMINOPHEN 10-325 MG PO TAB
1 | ORAL_TABLET | Freq: Two times a day (BID) | ORAL | 0 refills | 15.00000 days | Status: AC | PRN
Start: 2017-04-29 — End: ?

## 2017-04-29 MED ORDER — HYDROCODONE-ACETAMINOPHEN 10-325 MG PO TAB
1 | ORAL_TABLET | ORAL | 0 refills | Status: CN | PRN
Start: 2017-04-29 — End: ?

## 2017-04-29 MED ORDER — VOLTAREN 1 % TP GEL
2 g | Freq: Four times a day (QID) | TOPICAL | 3 refills | 30.00000 days | Status: AC
Start: 2017-04-29 — End: 2017-06-27

## 2017-05-06 ENCOUNTER — Encounter: Admit: 2017-05-06 | Discharge: 2017-05-06

## 2017-06-07 ENCOUNTER — Encounter: Admit: 2017-06-07 | Discharge: 2017-06-07

## 2017-06-07 MED ORDER — DICLOFENAC SODIUM 1 % TP GEL
2 g | Freq: Four times a day (QID) | TOPICAL | 3 refills | 25.00000 days | Status: AC
Start: 2017-06-07 — End: 2017-08-10

## 2017-06-27 ENCOUNTER — Encounter: Admit: 2017-06-27 | Discharge: 2017-06-27

## 2017-06-27 ENCOUNTER — Ambulatory Visit: Admit: 2017-06-27 | Discharge: 2017-06-28 | Payer: Commercial Managed Care - PPO

## 2017-06-27 DIAGNOSIS — E785 Hyperlipidemia, unspecified: ICD-10-CM

## 2017-06-27 DIAGNOSIS — E119 Type 2 diabetes mellitus without complications: ICD-10-CM

## 2017-06-27 DIAGNOSIS — I10 Essential (primary) hypertension: Principal | ICD-10-CM

## 2017-06-27 MED ORDER — HYDROCODONE-ACETAMINOPHEN 10-325 MG PO TAB
1 | ORAL_TABLET | ORAL | 0 refills | 30.00000 days | Status: AC | PRN
Start: 2017-06-27 — End: 2017-09-23
  Filled 2017-06-27 (×2): qty 120, 30d supply, fill #1

## 2017-06-27 MED ORDER — VOLTAREN 1 % TP GEL
2 g | Freq: Four times a day (QID) | TOPICAL | 3 refills | 30.00000 days | Status: AC
Start: 2017-06-27 — End: 2017-08-10

## 2017-06-28 DIAGNOSIS — M5116 Intervertebral disc disorders with radiculopathy, lumbar region: Principal | ICD-10-CM

## 2017-06-28 DIAGNOSIS — M5416 Radiculopathy, lumbar region: ICD-10-CM

## 2017-07-06 ENCOUNTER — Encounter: Admit: 2017-07-06 | Discharge: 2017-07-06

## 2017-07-06 ENCOUNTER — Ambulatory Visit: Admit: 2017-07-06 | Discharge: 2017-07-07

## 2017-07-06 DIAGNOSIS — E119 Type 2 diabetes mellitus without complications: ICD-10-CM

## 2017-07-06 DIAGNOSIS — M5416 Radiculopathy, lumbar region: Principal | ICD-10-CM

## 2017-07-06 DIAGNOSIS — I10 Essential (primary) hypertension: Principal | ICD-10-CM

## 2017-07-06 DIAGNOSIS — E785 Hyperlipidemia, unspecified: ICD-10-CM

## 2017-07-06 MED ORDER — TRIAMCINOLONE ACETONIDE 40 MG/ML IJ SUSP
80 mg | Freq: Once | EPIDURAL | 0 refills | Status: CP
Start: 2017-07-06 — End: ?
  Administered 2017-07-06: 15:00:00 80 mg via EPIDURAL

## 2017-07-06 MED ORDER — IOPAMIDOL 41 % IT SOLN
2.5 mL | Freq: Once | EPIDURAL | 0 refills | Status: CP
Start: 2017-07-06 — End: ?
  Administered 2017-07-06: 15:00:00 2.5 mL via EPIDURAL

## 2017-07-07 ENCOUNTER — Ambulatory Visit: Admit: 2017-07-06 | Discharge: 2017-07-07 | Payer: Commercial Managed Care - PPO

## 2017-07-07 DIAGNOSIS — E119 Type 2 diabetes mellitus without complications: ICD-10-CM

## 2017-07-07 DIAGNOSIS — Z8249 Family history of ischemic heart disease and other diseases of the circulatory system: ICD-10-CM

## 2017-07-07 DIAGNOSIS — E785 Hyperlipidemia, unspecified: ICD-10-CM

## 2017-07-07 DIAGNOSIS — I10 Essential (primary) hypertension: ICD-10-CM

## 2017-07-07 DIAGNOSIS — M961 Postlaminectomy syndrome, not elsewhere classified: Principal | ICD-10-CM

## 2017-07-07 DIAGNOSIS — Z880 Allergy status to penicillin: ICD-10-CM

## 2017-07-07 DIAGNOSIS — M5416 Radiculopathy, lumbar region: Secondary | ICD-10-CM

## 2017-07-08 ENCOUNTER — Encounter: Admit: 2017-07-08 | Discharge: 2017-07-08

## 2017-07-11 ENCOUNTER — Encounter: Admit: 2017-07-11 | Discharge: 2017-07-11

## 2017-07-11 ENCOUNTER — Ambulatory Visit: Admit: 2017-07-11 | Discharge: 2017-07-11 | Payer: Commercial Managed Care - PPO

## 2017-07-11 ENCOUNTER — Ambulatory Visit: Admit: 2017-07-11 | Discharge: 2017-07-11

## 2017-07-11 DIAGNOSIS — M48062 Spinal stenosis, lumbar region with neurogenic claudication: Secondary | ICD-10-CM

## 2017-07-11 DIAGNOSIS — I10 Essential (primary) hypertension: Principal | ICD-10-CM

## 2017-07-11 DIAGNOSIS — M5116 Intervertebral disc disorders with radiculopathy, lumbar region: ICD-10-CM

## 2017-07-11 DIAGNOSIS — M5416 Radiculopathy, lumbar region: Principal | ICD-10-CM

## 2017-07-11 DIAGNOSIS — E119 Type 2 diabetes mellitus without complications: ICD-10-CM

## 2017-07-11 DIAGNOSIS — E785 Hyperlipidemia, unspecified: ICD-10-CM

## 2017-07-14 ENCOUNTER — Encounter: Admit: 2017-07-14 | Discharge: 2017-07-14

## 2017-08-10 ENCOUNTER — Encounter: Admit: 2017-08-10 | Discharge: 2017-08-10

## 2017-08-10 MED ORDER — BACLOFEN 10 MG PO TAB
10 mg | ORAL_TABLET | Freq: Two times a day (BID) | ORAL | 3 refills | Status: AC
Start: 2017-08-10 — End: 2018-05-31

## 2017-08-10 MED ORDER — DICLOFENAC SODIUM 1 % TP GEL
4 g | Freq: Four times a day (QID) | TOPICAL | 3 refills | 19.00000 days | Status: AC
Start: 2017-08-10 — End: 2017-11-08

## 2017-08-29 ENCOUNTER — Encounter: Admit: 2017-08-29 | Discharge: 2017-08-29

## 2017-08-29 DIAGNOSIS — M5116 Intervertebral disc disorders with radiculopathy, lumbar region: ICD-10-CM

## 2017-08-29 DIAGNOSIS — M961 Postlaminectomy syndrome, not elsewhere classified: ICD-10-CM

## 2017-08-29 MED ORDER — METHYLPREDNISOLONE 4 MG PO DSPK
ORAL_TABLET | 0 refills | Status: AC
Start: 2017-08-29 — End: 2018-01-23

## 2017-09-05 ENCOUNTER — Encounter: Admit: 2017-09-05 | Discharge: 2017-09-05

## 2017-09-05 MED ORDER — GABAPENTIN 800 MG PO TAB
800 mg | ORAL_TABLET | ORAL | 3 refills | Status: AC
Start: 2017-09-05 — End: 2017-09-23

## 2017-09-20 ENCOUNTER — Encounter: Admit: 2017-09-20 | Discharge: 2017-09-20

## 2017-09-20 DIAGNOSIS — E785 Hyperlipidemia, unspecified: ICD-10-CM

## 2017-09-20 DIAGNOSIS — I10 Essential (primary) hypertension: Principal | ICD-10-CM

## 2017-09-20 DIAGNOSIS — E119 Type 2 diabetes mellitus without complications: ICD-10-CM

## 2017-09-22 ENCOUNTER — Encounter: Admit: 2017-09-22 | Discharge: 2017-09-22

## 2017-09-22 ENCOUNTER — Ambulatory Visit: Admit: 2017-09-22 | Discharge: 2017-09-22

## 2017-09-22 DIAGNOSIS — E785 Hyperlipidemia, unspecified: ICD-10-CM

## 2017-09-22 DIAGNOSIS — I10 Essential (primary) hypertension: Principal | ICD-10-CM

## 2017-09-22 DIAGNOSIS — E119 Type 2 diabetes mellitus without complications: ICD-10-CM

## 2017-09-23 ENCOUNTER — Ambulatory Visit: Admit: 2017-09-23 | Discharge: 2017-09-23 | Payer: Commercial Managed Care - PPO

## 2017-09-23 ENCOUNTER — Encounter: Admit: 2017-09-23 | Discharge: 2017-09-23

## 2017-09-23 ENCOUNTER — Ambulatory Visit: Admit: 2017-09-23 | Discharge: 2017-09-24

## 2017-09-23 ENCOUNTER — Ambulatory Visit: Admit: 2017-09-23 | Discharge: 2017-09-23

## 2017-09-23 DIAGNOSIS — M5416 Radiculopathy, lumbar region: Principal | ICD-10-CM

## 2017-09-23 DIAGNOSIS — E785 Hyperlipidemia, unspecified: ICD-10-CM

## 2017-09-23 DIAGNOSIS — E119 Type 2 diabetes mellitus without complications: ICD-10-CM

## 2017-09-23 DIAGNOSIS — I10 Essential (primary) hypertension: Principal | ICD-10-CM

## 2017-09-23 MED ORDER — ONDANSETRON HCL (PF) 4 MG/2 ML IJ SOLN
4 mg | Freq: Once | INTRAVENOUS | 0 refills | Status: AC | PRN
Start: 2017-09-23 — End: ?

## 2017-09-23 MED ORDER — BACITRACIN 50,000 UN NS 500 ML IRR BOT (OR)
0 refills | Status: DC
Start: 2017-09-23 — End: 2017-09-28

## 2017-09-23 MED ORDER — FENTANYL CITRATE (PF) 50 MCG/ML IJ SOLN
25 ug | INTRAVENOUS | 0 refills | Status: DC | PRN
Start: 2017-09-23 — End: 2017-11-22

## 2017-09-23 MED ORDER — FENTANYL CITRATE (PF) 50 MCG/ML IJ SOLN
50 ug | INTRAVENOUS | 0 refills | Status: DC | PRN
Start: 2017-09-23 — End: 2017-11-22

## 2017-09-23 MED ORDER — PROPOFOL INJ 10 MG/ML IV VIAL
0 refills | Status: DC
Start: 2017-09-23 — End: 2017-09-23

## 2017-09-23 MED ORDER — LIDOCAINE (PF) 10 MG/ML (1 %) IJ SOLN
.1-2 mL | INTRAMUSCULAR | 0 refills | Status: DC | PRN
Start: 2017-09-23 — End: 2018-02-22

## 2017-09-23 MED ORDER — LIDOCAINE HCL 10 MG/ML (1 %) IJ SOLN
0 refills | Status: DC
Start: 2017-09-23 — End: 2017-09-28

## 2017-09-23 MED ORDER — LACTATED RINGERS IV SOLP
500 mL | INTRAVENOUS | 0 refills | Status: AC
Start: 2017-09-23 — End: ?

## 2017-09-23 MED ORDER — FENTANYL CITRATE (PF) 50 MCG/ML IJ SOLN
0 refills | Status: DC
Start: 2017-09-23 — End: 2017-09-23

## 2017-09-23 MED ORDER — PROPOFOL 10 MG/ML IV EMUL 20 ML (INFUSION)(AM)(OR)
INTRAVENOUS | 0 refills | Status: DC
Start: 2017-09-23 — End: 2017-09-23

## 2017-09-23 MED ORDER — LACTATED RINGERS IV SOLP
1000 mL | INTRAVENOUS | 0 refills | Status: AC
Start: 2017-09-23 — End: ?

## 2017-09-23 MED ORDER — GABAPENTIN 800 MG PO TAB
800 mg | ORAL_TABLET | ORAL | 3 refills | Status: AC
Start: 2017-09-23 — End: 2017-10-31

## 2017-09-23 MED ORDER — MIDAZOLAM 1 MG/ML IJ SOLN
INTRAVENOUS | 0 refills | Status: DC
Start: 2017-09-23 — End: 2017-09-23

## 2017-09-23 MED ORDER — INSULIN REGULAR HUMAN 100 UNIT/ML IJ SOLN
4 [IU] | Freq: Once | SUBCUTANEOUS | 0 refills | Status: CP
Start: 2017-09-23 — End: ?

## 2017-09-23 MED ORDER — CEFAZOLIN 1 GRAM IJ SOLR
0 refills | Status: DC
Start: 2017-09-23 — End: 2017-09-23

## 2017-09-23 MED ORDER — HYDROCODONE-ACETAMINOPHEN 10-325 MG PO TAB
1 | ORAL_TABLET | ORAL | 0 refills | 15.00000 days | Status: AC | PRN
Start: 2017-09-23 — End: 2017-09-29

## 2017-09-23 MED ORDER — OXYCODONE 5 MG PO TAB
5-10 mg | Freq: Once | ORAL | 0 refills | Status: AC | PRN
Start: 2017-09-23 — End: ?

## 2017-09-23 MED ORDER — LACTATED RINGERS IV SOLP
0 refills | Status: DC
Start: 2017-09-23 — End: 2017-09-23

## 2017-09-23 MED ORDER — ACETAMINOPHEN 500 MG PO TAB
1000 mg | Freq: Once | ORAL | 0 refills | Status: CP
Start: 2017-09-23 — End: ?

## 2017-09-23 MED ORDER — PROMETHAZINE 25 MG/ML IJ SOLN
6.25 mg | INTRAVENOUS | 0 refills | Status: DC | PRN
Start: 2017-09-23 — End: 2018-02-22

## 2017-09-23 MED ORDER — BUPIVACAINE-EPINEPHRINE 0.25 %-1:200,000 IJ SOLN
0 refills | Status: DC
Start: 2017-09-23 — End: 2017-09-28

## 2017-09-26 ENCOUNTER — Encounter: Admit: 2017-09-26 | Discharge: 2017-09-26

## 2017-09-26 DIAGNOSIS — E119 Type 2 diabetes mellitus without complications: ICD-10-CM

## 2017-09-26 DIAGNOSIS — E785 Hyperlipidemia, unspecified: ICD-10-CM

## 2017-09-26 DIAGNOSIS — I10 Essential (primary) hypertension: Principal | ICD-10-CM

## 2017-09-29 ENCOUNTER — Encounter: Admit: 2017-09-29 | Discharge: 2017-09-29

## 2017-09-29 ENCOUNTER — Ambulatory Visit: Admit: 2017-09-29 | Discharge: 2017-09-30 | Payer: Commercial Managed Care - PPO

## 2017-09-29 DIAGNOSIS — E785 Hyperlipidemia, unspecified: ICD-10-CM

## 2017-09-29 DIAGNOSIS — M5416 Radiculopathy, lumbar region: ICD-10-CM

## 2017-09-29 DIAGNOSIS — I10 Essential (primary) hypertension: Principal | ICD-10-CM

## 2017-09-29 DIAGNOSIS — E119 Type 2 diabetes mellitus without complications: ICD-10-CM

## 2017-09-29 DIAGNOSIS — M5116 Intervertebral disc disorders with radiculopathy, lumbar region: Principal | ICD-10-CM

## 2017-09-29 MED ORDER — HYDROCODONE-ACETAMINOPHEN 10-325 MG PO TAB
1 | ORAL_TABLET | ORAL | 0 refills | 30.00000 days | Status: AC | PRN
Start: 2017-09-29 — End: 2017-10-31
  Filled 2017-09-29 (×2): qty 120, 30d supply, fill #1

## 2017-10-04 ENCOUNTER — Ambulatory Visit: Admit: 2017-10-04 | Discharge: 2017-10-05 | Payer: Commercial Managed Care - PPO

## 2017-10-04 ENCOUNTER — Encounter: Admit: 2017-10-04 | Discharge: 2017-10-04

## 2017-10-04 DIAGNOSIS — M5116 Intervertebral disc disorders with radiculopathy, lumbar region: Principal | ICD-10-CM

## 2017-10-04 DIAGNOSIS — M5416 Radiculopathy, lumbar region: ICD-10-CM

## 2017-10-04 DIAGNOSIS — E785 Hyperlipidemia, unspecified: ICD-10-CM

## 2017-10-04 DIAGNOSIS — E119 Type 2 diabetes mellitus without complications: ICD-10-CM

## 2017-10-04 DIAGNOSIS — I10 Essential (primary) hypertension: Principal | ICD-10-CM

## 2017-10-31 ENCOUNTER — Ambulatory Visit: Admit: 2017-10-31 | Discharge: 2017-11-01 | Payer: Commercial Managed Care - PPO

## 2017-10-31 ENCOUNTER — Encounter: Admit: 2017-10-31 | Discharge: 2017-10-31

## 2017-10-31 DIAGNOSIS — M5416 Radiculopathy, lumbar region: ICD-10-CM

## 2017-10-31 DIAGNOSIS — E785 Hyperlipidemia, unspecified: ICD-10-CM

## 2017-10-31 DIAGNOSIS — I10 Essential (primary) hypertension: Principal | ICD-10-CM

## 2017-10-31 DIAGNOSIS — M5116 Intervertebral disc disorders with radiculopathy, lumbar region: Principal | ICD-10-CM

## 2017-10-31 DIAGNOSIS — E119 Type 2 diabetes mellitus without complications: ICD-10-CM

## 2017-10-31 MED ORDER — HYDROCODONE-ACETAMINOPHEN 10-325 MG PO TAB
1 | ORAL_TABLET | ORAL | 0 refills | 30.00000 days | Status: AC | PRN
Start: 2017-10-31 — End: 2018-01-23

## 2017-10-31 MED ORDER — GABAPENTIN 800 MG PO TAB
800 mg | ORAL_TABLET | ORAL | 3 refills | Status: AC
Start: 2017-10-31 — End: ?

## 2017-11-08 ENCOUNTER — Encounter: Admit: 2017-11-08 | Discharge: 2017-11-08

## 2017-11-08 MED ORDER — DICLOFENAC SODIUM 1 % TP GEL
4 g | Freq: Four times a day (QID) | TOPICAL | 3 refills | 19.00000 days | Status: AC
Start: 2017-11-08 — End: 2018-03-06

## 2017-11-16 ENCOUNTER — Encounter: Admit: 2017-11-16 | Discharge: 2017-11-16

## 2017-11-17 ENCOUNTER — Ambulatory Visit: Admit: 2017-11-17 | Discharge: 2017-11-18 | Payer: Commercial Managed Care - PPO

## 2017-11-17 ENCOUNTER — Encounter: Admit: 2017-11-17 | Discharge: 2017-11-17

## 2017-11-17 DIAGNOSIS — I10 Essential (primary) hypertension: Principal | ICD-10-CM

## 2017-11-17 DIAGNOSIS — E785 Hyperlipidemia, unspecified: ICD-10-CM

## 2017-11-17 DIAGNOSIS — E119 Type 2 diabetes mellitus without complications: ICD-10-CM

## 2017-11-18 DIAGNOSIS — M48062 Spinal stenosis, lumbar region with neurogenic claudication: ICD-10-CM

## 2017-11-18 DIAGNOSIS — M4316 Spondylolisthesis, lumbar region: Principal | ICD-10-CM

## 2017-11-22 ENCOUNTER — Ambulatory Visit: Admit: 2017-11-22 | Discharge: 2017-11-22

## 2017-11-22 ENCOUNTER — Encounter: Admit: 2017-11-22 | Discharge: 2017-11-22

## 2017-11-22 ENCOUNTER — Ambulatory Visit: Admit: 2017-11-22 | Discharge: 2017-11-22 | Payer: Commercial Managed Care - PPO

## 2017-11-22 DIAGNOSIS — M4316 Spondylolisthesis, lumbar region: Principal | ICD-10-CM

## 2017-11-22 DIAGNOSIS — M48062 Spinal stenosis, lumbar region with neurogenic claudication: ICD-10-CM

## 2017-11-22 DIAGNOSIS — Z880 Allergy status to penicillin: ICD-10-CM

## 2017-11-22 DIAGNOSIS — M5136 Other intervertebral disc degeneration, lumbar region: ICD-10-CM

## 2017-11-22 DIAGNOSIS — M48061 Spinal stenosis, lumbar region without neurogenic claudication: ICD-10-CM

## 2017-11-22 DIAGNOSIS — Q7649 Other congenital malformations of spine, not associated with scoliosis: Secondary | ICD-10-CM

## 2017-11-22 MED ORDER — HYDROCODONE-ACETAMINOPHEN 10-325 MG PO TAB
1 | ORAL_TABLET | ORAL | 0 refills | 15.00000 days | Status: DC | PRN
Start: 2017-11-22 — End: 2018-01-23
  Filled 2017-11-22 (×2): qty 120, 30d supply, fill #1
  Filled 2017-11-22: qty 120, 13d supply

## 2017-11-22 MED ORDER — IOPAMIDOL 41 % IT SOLN
15 mL | Freq: Once | INTRATHECAL | 0 refills | Status: CP
Start: 2017-11-22 — End: ?
  Administered 2017-11-22: 17:00:00 15 mL via INTRATHECAL

## 2017-12-02 ENCOUNTER — Encounter: Admit: 2017-12-02 | Discharge: 2017-12-02

## 2017-12-02 DIAGNOSIS — M4316 Spondylolisthesis, lumbar region: Principal | ICD-10-CM

## 2017-12-02 DIAGNOSIS — M48062 Spinal stenosis, lumbar region with neurogenic claudication: ICD-10-CM

## 2018-01-23 ENCOUNTER — Ambulatory Visit: Admit: 2018-01-23 | Discharge: 2018-01-23

## 2018-01-23 ENCOUNTER — Inpatient Hospital Stay: Admit: 2018-01-23 | Discharge: 2018-01-23

## 2018-01-23 ENCOUNTER — Inpatient Hospital Stay
Admit: 2018-02-21 | Discharge: 2018-02-25 | Disposition: A | Discharge status: Home / Self Care | Payer: Commercial Managed Care - PPO

## 2018-01-23 ENCOUNTER — Ambulatory Visit: Admit: 2018-01-23 | Discharge: 2018-01-23 | Payer: Commercial Managed Care - PPO

## 2018-01-23 ENCOUNTER — Encounter: Admit: 2018-01-23 | Discharge: 2018-01-23

## 2018-01-23 DIAGNOSIS — Z01818 Encounter for other preprocedural examination: ICD-10-CM

## 2018-01-23 DIAGNOSIS — E119 Type 2 diabetes mellitus without complications: ICD-10-CM

## 2018-01-23 DIAGNOSIS — Z01810 Encounter for preprocedural cardiovascular examination: ICD-10-CM

## 2018-01-23 DIAGNOSIS — M48062 Spinal stenosis, lumbar region with neurogenic claudication: Principal | ICD-10-CM

## 2018-01-23 DIAGNOSIS — M4316 Spondylolisthesis, lumbar region: Secondary | ICD-10-CM

## 2018-01-23 DIAGNOSIS — J869 Pyothorax without fistula: ICD-10-CM

## 2018-01-23 DIAGNOSIS — I10 Essential (primary) hypertension: Principal | ICD-10-CM

## 2018-01-23 DIAGNOSIS — M48061 Spinal stenosis, lumbar region without neurogenic claudication: ICD-10-CM

## 2018-01-23 DIAGNOSIS — M1990 Unspecified osteoarthritis, unspecified site: ICD-10-CM

## 2018-01-23 DIAGNOSIS — J189 Pneumonia, unspecified organism: ICD-10-CM

## 2018-01-23 DIAGNOSIS — E785 Hyperlipidemia, unspecified: ICD-10-CM

## 2018-01-23 LAB — BASIC METABOLIC PANEL
Lab: 0.9 mg/dL (ref 0.4–1.24)
Lab: 101 mg/dL — ABNORMAL HIGH (ref 70–100)
Lab: 110 MMOL/L (ref 98–110)
Lab: 141 MMOL/L (ref 137–147)
Lab: 23 MMOL/L (ref 21–30)
Lab: 24 mg/dL (ref 7–25)
Lab: 4.3 MMOL/L (ref 3.5–5.1)
Lab: 8 pg (ref 3–12)
Lab: 9.4 mg/dL (ref 8.5–10.6)
Lab: >60 mL/min (ref >60)
Lab: >60 mL/min (ref >60)

## 2018-01-23 LAB — CBC: Lab: 8.9 10*3/uL (ref 4.5–11.0)

## 2018-01-23 MED ORDER — HYDROCODONE-ACETAMINOPHEN 10-325 MG PO TAB
1 | ORAL_TABLET | ORAL | 0 refills | Status: SS | PRN
Start: 2018-01-23 — End: 2018-02-25

## 2018-01-23 MED FILL — HYDROCODONE-ACETAMINOPHEN 10-325 MG PO TAB: 10/325 mg | ORAL | 30 days supply | Qty: 120 | Fill #1 | Status: CP

## 2018-01-24 ENCOUNTER — Encounter: Admit: 2018-01-24 | Discharge: 2018-01-24

## 2018-02-21 ENCOUNTER — Inpatient Hospital Stay: Admit: 2018-02-21 | Discharge: 2018-02-21

## 2018-02-21 ENCOUNTER — Encounter: Admit: 2018-02-21 | Discharge: 2018-02-21

## 2018-02-21 DIAGNOSIS — J189 Pneumonia, unspecified organism: ICD-10-CM

## 2018-02-21 DIAGNOSIS — J869 Pyothorax without fistula: ICD-10-CM

## 2018-02-21 DIAGNOSIS — M1990 Unspecified osteoarthritis, unspecified site: ICD-10-CM

## 2018-02-21 DIAGNOSIS — I10 Essential (primary) hypertension: Principal | ICD-10-CM

## 2018-02-21 DIAGNOSIS — M48061 Spinal stenosis, lumbar region without neurogenic claudication: ICD-10-CM

## 2018-02-21 DIAGNOSIS — E785 Hyperlipidemia, unspecified: ICD-10-CM

## 2018-02-21 DIAGNOSIS — E119 Type 2 diabetes mellitus without complications: ICD-10-CM

## 2018-02-21 LAB — SODIUM,BG: Lab: 138 MMOL/L (ref 137–147)

## 2018-02-21 LAB — HEMOGLOBIN & HEMATOCRIT, BG: Lab: 12 g/dL — ABNORMAL LOW (ref 13.5–16.5)

## 2018-02-21 LAB — POC GLUCOSE
Lab: 133 mg/dL — ABNORMAL HIGH (ref 70–100)
Lab: 106 mg/dL — ABNORMAL HIGH (ref 70–100)

## 2018-02-21 LAB — BLOOD GASES, ARTERIAL
Lab: 7.3 % — ABNORMAL LOW (ref 7.35–7.45)
Lab: 97 % (ref 95–99)

## 2018-02-21 LAB — POTASSIUM, BG: Lab: 3.9 MMOL/L (ref 3.5–5.1)

## 2018-02-21 LAB — GLUCOSE,BG: Lab: 122 mg/dL — ABNORMAL HIGH (ref 70–100)

## 2018-02-21 LAB — LACTIC ACID (BG - RAPID LACTATE): Lab: 0.9 MMOL/L (ref 0.5–2.0)

## 2018-02-21 LAB — IONIZED CALCIUM,BG: Lab: 1.1 MMOL/L — ABNORMAL HIGH (ref 1.0–1.3)

## 2018-02-21 MED ORDER — LIDOCAINE (PF) 10 MG/ML (1 %) IJ SOLN
.1-2 mL | INTRAMUSCULAR | 0 refills | Status: DC | PRN
Start: 2018-02-21 — End: 2018-02-22

## 2018-02-21 MED ORDER — ATORVASTATIN 40 MG PO TAB
40 mg | Freq: Every day | ORAL | 0 refills | Status: DC
Start: 2018-02-21 — End: 2018-02-25
  Administered 2018-02-22 – 2018-02-25 (×4): 40 mg via ORAL

## 2018-02-21 MED ORDER — ACETAMINOPHEN 500 MG PO TAB
500-1000 mg | ORAL | 0 refills | Status: DC
Start: 2018-02-21 — End: 2018-02-25
  Administered 2018-02-24 (×2): 1000 mg via ORAL
  Administered 2018-02-24 – 2018-02-25 (×2): 500 mg via ORAL
  Administered 2018-02-25 (×2): 1000 mg via ORAL

## 2018-02-21 MED ORDER — BISACODYL 10 MG RE SUPP
10 mg | Freq: Every day | RECTAL | 0 refills | Status: DC | PRN
Start: 2018-02-21 — End: 2018-02-23

## 2018-02-21 MED ORDER — MIDAZOLAM 1 MG/ML IJ SOLN
INTRAVENOUS | 0 refills | Status: DC
Start: 2018-02-21 — End: 2018-02-22
  Administered 2018-02-21: 16:00:00 2 mg via INTRAVENOUS

## 2018-02-21 MED ORDER — OXYCODONE SR 10 MG PO 12HR TABLET
10 mg | Freq: Once | ORAL | 0 refills | Status: CP
Start: 2018-02-21 — End: ?
  Administered 2018-02-21: 15:00:00 10 mg via ORAL

## 2018-02-21 MED ORDER — FENTANYL PCA 550 MCG/55 ML SYR (STD CONC)(ADULT)(PREMADE)
INTRAVENOUS | 0 refills | Status: DC
Start: 2018-02-21 — End: 2018-02-22
  Administered 2018-02-22: 01:00:00 55.000 mL via INTRAVENOUS

## 2018-02-21 MED ORDER — HYDROMORPHONE (PF) 2 MG/ML IJ SYRG
.5-1 mg | INTRAVENOUS | 0 refills | Status: DC | PRN
Start: 2018-02-21 — End: 2018-02-22
  Administered 2018-02-22 (×2): 0.5 mg via INTRAVENOUS
  Administered 2018-02-22: 01:00:00 1 mg via INTRAVENOUS

## 2018-02-21 MED ORDER — DIPHENHYDRAMINE HCL 50 MG/ML IJ SOLN
25 mg | INTRAVENOUS | 0 refills | Status: DC | PRN
Start: 2018-02-21 — End: 2018-02-25

## 2018-02-21 MED ORDER — GABAPENTIN 400 MG PO CAP
800 mg | ORAL | 0 refills | Status: DC
Start: 2018-02-21 — End: 2018-02-25
  Administered 2018-02-22 – 2018-02-25 (×11): 800 mg via ORAL

## 2018-02-21 MED ORDER — KETAMINE 10 MG/ML IJ SOLN (INFUSION)(AM)(OR)
0 refills | Status: DC
Start: 2018-02-21 — End: 2018-02-22
  Administered 2018-02-21: 17:00:00 200 ug/kg/h via INTRAVENOUS

## 2018-02-21 MED ORDER — DOCUSATE SODIUM 100 MG PO CAP
100 mg | Freq: Two times a day (BID) | ORAL | 0 refills | Status: DC
Start: 2018-02-21 — End: 2018-02-25
  Administered 2018-02-22 – 2018-02-25 (×7): 100 mg via ORAL

## 2018-02-21 MED ORDER — ACETAMINOPHEN 1,000 MG/100 ML (10 MG/ML) IV SOLN
0 refills | Status: DC
Start: 2018-02-21 — End: 2018-02-22
  Administered 2018-02-21: 23:00:00 1000 mg via INTRAVENOUS

## 2018-02-21 MED ORDER — LACTATED RINGERS IV SOLP
250 mL | INTRAVENOUS | 0 refills | Status: DC
Start: 2018-02-21 — End: 2018-02-22

## 2018-02-21 MED ORDER — PHENYLEPHRINE IN 0.9% NACL(PF) 1 MG/10 ML (100 MCG/ML) IV SYRG
INTRAVENOUS | 0 refills | Status: DC
Start: 2018-02-21 — End: 2018-02-22
  Administered 2018-02-21 (×3): 100 ug via INTRAVENOUS

## 2018-02-21 MED ORDER — TRANEXAMIC ACID IN NS IVPB (AM)(OR)
1000 mg | Freq: Once | INTRAVENOUS | 0 refills | Status: CP
Start: 2018-02-21 — End: ?
  Administered 2018-02-21 (×2): 1 g via INTRAVENOUS

## 2018-02-21 MED ORDER — FENTANYL CITRATE (PF) 50 MCG/ML IJ SOLN
0 refills | Status: DC
Start: 2018-02-21 — End: 2018-02-22
  Administered 2018-02-21: 23:00:00 50 ug via INTRAVENOUS
  Administered 2018-02-21: 16:00:00 100 ug via INTRAVENOUS
  Administered 2018-02-21: 50 ug via INTRAVENOUS

## 2018-02-21 MED ORDER — AUTOLOGOUS BLOOD (CELL SAVER)
0 refills | Status: DC
Start: 2018-02-21 — End: 2018-02-22

## 2018-02-21 MED ORDER — BACLOFEN 10 MG PO TAB
10 mg | Freq: Two times a day (BID) | ORAL | 0 refills | Status: DC
Start: 2018-02-21 — End: 2018-02-25
  Administered 2018-02-22 – 2018-02-25 (×8): 10 mg via ORAL

## 2018-02-21 MED ORDER — ONDANSETRON HCL (PF) 4 MG/2 ML IJ SOLN
4 mg | INTRAVENOUS | 0 refills | Status: DC | PRN
Start: 2018-02-21 — End: 2018-02-25
  Administered 2018-02-22 – 2018-02-23 (×2): 4 mg via INTRAVENOUS

## 2018-02-21 MED ORDER — CEFAZOLIN 1 GRAM IJ SOLR
0 refills | Status: DC
Start: 2018-02-21 — End: 2018-02-22
  Administered 2018-02-21 (×2): 3 g via INTRAVENOUS

## 2018-02-21 MED ORDER — ROCURONIUM 10 MG/ML IV SOLN
INTRAVENOUS | 0 refills | Status: DC
Start: 2018-02-21 — End: 2018-02-22
  Administered 2018-02-21: 18:00:00 10 mg via INTRAVENOUS
  Administered 2018-02-21 (×2): 20 mg via INTRAVENOUS

## 2018-02-21 MED ORDER — ONDANSETRON HCL (PF) 4 MG/2 ML IJ SOLN
INTRAVENOUS | 0 refills | Status: DC
Start: 2018-02-21 — End: 2018-02-22
  Administered 2018-02-21: 23:00:00 4 mg via INTRAVENOUS

## 2018-02-21 MED ORDER — REMIFENTANYL 1000MCG IN NS 20ML (OR)
0 refills | Status: DC
Start: 2018-02-21 — End: 2018-02-22
  Administered 2018-02-21 (×2): 20.000 mL via INTRAVENOUS
  Administered 2018-02-21: 17:00:00 .1 ug/kg/min via INTRAVENOUS
  Administered 2018-02-21 (×6): 20.000 mL via INTRAVENOUS

## 2018-02-21 MED ORDER — CYCLOBENZAPRINE 10 MG PO TAB
10 mg | Freq: Once | ORAL | 0 refills | Status: CP
Start: 2018-02-21 — End: ?
  Administered 2018-02-21: 15:00:00 10 mg via ORAL

## 2018-02-21 MED ORDER — DIPHENHYDRAMINE HCL 50 MG/ML IJ SOLN
25 mg | Freq: Once | INTRAVENOUS | 0 refills | Status: DC | PRN
Start: 2018-02-21 — End: 2018-02-22

## 2018-02-21 MED ORDER — FENTANYL CITRATE (PF) 50 MCG/ML IJ SOLN
25-50 ug | INTRAVENOUS | 0 refills | Status: DC | PRN
Start: 2018-02-21 — End: 2018-02-22
  Administered 2018-02-22 (×4): 50 ug via INTRAVENOUS

## 2018-02-21 MED ORDER — FAMOTIDINE 20 MG PO TAB
20 mg | Freq: Two times a day (BID) | ORAL | 0 refills | Status: DC
Start: 2018-02-21 — End: 2018-02-25
  Administered 2018-02-22 – 2018-02-25 (×7): 20 mg via ORAL

## 2018-02-21 MED ORDER — PROPOFOL 10 MG/ML IV EMUL (INFUSION)(AM)(OR)
0 refills | Status: DC
Start: 2018-02-21 — End: 2018-02-22
  Administered 2018-02-21 (×3): 100.000 mL via INTRAVENOUS
  Administered 2018-02-21: 16:00:00 150 ug/kg/min via INTRAVENOUS
  Administered 2018-02-21: 19:00:00 100.000 mL via INTRAVENOUS

## 2018-02-21 MED ORDER — HALOPERIDOL LACTATE 5 MG/ML IJ SOLN
1 mg | Freq: Once | INTRAVENOUS | 0 refills | Status: CP | PRN
Start: 2018-02-21 — End: ?
  Administered 2018-02-22: 01:00:00 1 mg via INTRAVENOUS

## 2018-02-21 MED ORDER — VANCOMYCIN 1,000 MG IV SOLR
0 refills | Status: DC
Start: 2018-02-21 — End: 2018-02-22
  Administered 2018-02-21: 23:00:00 2 g via TOPICAL

## 2018-02-21 MED ORDER — PROMETHAZINE 25 MG/ML IJ SOLN
6.25 mg | INTRAVENOUS | 0 refills | Status: DC | PRN
Start: 2018-02-21 — End: 2018-02-22

## 2018-02-21 MED ORDER — CEFAZOLIN INJ 1GM IVP
2 g | INTRAVENOUS | 0 refills | Status: CP
Start: 2018-02-21 — End: ?
  Administered 2018-02-22 (×2): 2 g via INTRAVENOUS

## 2018-02-21 MED ORDER — THROMBIN (BOVINE) 5,000 UNIT TP SOLR
0 refills | Status: DC
Start: 2018-02-21 — End: 2018-02-22
  Administered 2018-02-21: 17:00:00 5000 [IU] via TOPICAL

## 2018-02-21 MED ORDER — DEXTRAN 70-HYPROMELLOSE (PF) 0.1-0.3 % OP DPET
0 refills | Status: DC
Start: 2018-02-21 — End: 2018-02-22
  Administered 2018-02-21: 17:00:00 2 [drp] via OPHTHALMIC

## 2018-02-21 MED ORDER — PROPOFOL INJ 10 MG/ML IV VIAL
0 refills | Status: DC
Start: 2018-02-21 — End: 2018-02-22
  Administered 2018-02-21: 16:00:00 200 mg via INTRAVENOUS

## 2018-02-21 MED ORDER — NALOXONE 0.4 MG/ML IJ SOLN
.08 mg | INTRAVENOUS | 0 refills | Status: DC | PRN
Start: 2018-02-21 — End: 2018-02-22

## 2018-02-21 MED ORDER — PHENYLEPHRINE IV DRIP (STD CONC)
0 refills | Status: DC
Start: 2018-02-21 — End: 2018-02-22
  Administered 2018-02-21 (×2): 0.2 ug/kg/min via INTRAVENOUS

## 2018-02-21 MED ORDER — OXYCODONE 5 MG PO TAB
5-15 mg | ORAL | 0 refills | Status: DC | PRN
Start: 2018-02-21 — End: 2018-02-25
  Administered 2018-02-22 – 2018-02-24 (×12): 15 mg via ORAL
  Administered 2018-02-24: 17:00:00 10 mg via ORAL
  Administered 2018-02-24 – 2018-02-25 (×4): 15 mg via ORAL
  Administered 2018-02-25: 15:00:00 10 mg via ORAL

## 2018-02-21 MED ORDER — OXYCODONE 5 MG PO TAB
5-10 mg | Freq: Once | ORAL | 0 refills | Status: DC | PRN
Start: 2018-02-21 — End: 2018-02-22

## 2018-02-21 MED ORDER — POLYETHYLENE GLYCOL 3350 17 GRAM PO PWPK
1 | Freq: Two times a day (BID) | ORAL | 0 refills | Status: DC
Start: 2018-02-21 — End: 2018-02-23
  Administered 2018-02-22 – 2018-02-23 (×3): 17 g via ORAL

## 2018-02-21 MED ORDER — TRANEXAMIC ACID 1 G IVPB (WEIGHT BASED)
1 mg/kg/h | Freq: Once | INTRAVENOUS | 0 refills | Status: CP
Start: 2018-02-21 — End: ?
  Administered 2018-02-21 (×2): 1 mg/kg/h via INTRAVENOUS

## 2018-02-21 MED ORDER — CELECOXIB 100 MG PO CAP
100 mg | Freq: Once | ORAL | 0 refills | Status: CP
Start: 2018-02-21 — End: ?
  Administered 2018-02-21: 15:00:00 100 mg via ORAL

## 2018-02-21 MED ORDER — MAGNESIUM HYDROXIDE 2,400 MG/10 ML PO SUSP
10 mL | Freq: Every day | ORAL | 0 refills | Status: DC
Start: 2018-02-21 — End: 2018-02-23

## 2018-02-21 MED ORDER — LACTATED RINGERS IV SOLP
INTRAVENOUS | 0 refills | Status: DC
Start: 2018-02-21 — End: 2018-02-25
  Administered 2018-02-22 (×2): 1000.000 mL via INTRAVENOUS

## 2018-02-21 MED ORDER — ELECTROLYTE-A IV SOLP
0 refills | Status: DC
Start: 2018-02-21 — End: 2018-02-22
  Administered 2018-02-21 (×2): via INTRAVENOUS

## 2018-02-21 MED ORDER — OXYCODONE 5 MG PO TAB
5-15 mg | ORAL | 0 refills | Status: DC | PRN
Start: 2018-02-21 — End: 2018-02-22

## 2018-02-21 MED ORDER — ACETAMINOPHEN 500 MG PO TAB
1000 mg | Freq: Once | ORAL | 0 refills | Status: CP
Start: 2018-02-21 — End: ?
  Administered 2018-02-21: 15:00:00 1000 mg via ORAL

## 2018-02-21 MED ORDER — LACTATED RINGERS IV SOLP
1000 mL | INTRAVENOUS | 0 refills | Status: DC
Start: 2018-02-21 — End: 2018-02-22
  Administered 2018-02-21: 23:00:00 1000.000 mL via INTRAVENOUS

## 2018-02-21 MED ORDER — SUCCINYLCHOLINE CHLORIDE 20 MG/ML IJ SOLN
INTRAVENOUS | 0 refills | Status: DC
Start: 2018-02-21 — End: 2018-02-22
  Administered 2018-02-21: 16:00:00 140 mg via INTRAVENOUS

## 2018-02-21 MED ORDER — DIPHENHYDRAMINE HCL 25 MG PO CAP
25 mg | ORAL | 0 refills | Status: DC | PRN
Start: 2018-02-21 — End: 2018-02-25

## 2018-02-21 MED ORDER — SUGAMMADEX 100 MG/ML IV SOLN
INTRAVENOUS | 0 refills | Status: DC
Start: 2018-02-21 — End: 2018-02-22
  Administered 2018-02-21: 250 mg via INTRAVENOUS

## 2018-02-21 MED ORDER — LIDOCAINE (PF) 200 MG/10 ML (2 %) IJ SYRG
0 refills | Status: DC
Start: 2018-02-21 — End: 2018-02-22
  Administered 2018-02-21: 16:00:00 100 mg via INTRAVENOUS

## 2018-02-21 MED ORDER — ACETAMINOPHEN 500 MG PO TAB
1000 mg | ORAL | 0 refills | Status: AC
Start: 2018-02-21 — End: ?
  Administered 2018-02-22 – 2018-02-23 (×7): 1000 mg via ORAL

## 2018-02-21 MED ADMIN — LACTATED RINGERS IV SOLP [4318]: 1000 mL | INTRAVENOUS | @ 15:00:00 | Stop: 2018-02-21 | NDC 00338011704

## 2018-02-22 ENCOUNTER — Encounter: Admit: 2018-02-22 | Discharge: 2018-02-22

## 2018-02-22 DIAGNOSIS — M1990 Unspecified osteoarthritis, unspecified site: ICD-10-CM

## 2018-02-22 DIAGNOSIS — I10 Essential (primary) hypertension: Principal | ICD-10-CM

## 2018-02-22 DIAGNOSIS — J869 Pyothorax without fistula: ICD-10-CM

## 2018-02-22 DIAGNOSIS — E119 Type 2 diabetes mellitus without complications: ICD-10-CM

## 2018-02-22 DIAGNOSIS — M48061 Spinal stenosis, lumbar region without neurogenic claudication: ICD-10-CM

## 2018-02-22 DIAGNOSIS — E785 Hyperlipidemia, unspecified: ICD-10-CM

## 2018-02-22 DIAGNOSIS — J189 Pneumonia, unspecified organism: ICD-10-CM

## 2018-02-22 LAB — CBC
Lab: 12 g/dL — ABNORMAL LOW (ref 13.5–16.5)
Lab: 13 % (ref 11–15)
Lab: 15 10*3/uL — ABNORMAL HIGH (ref 4.5–11.0)
Lab: 261 10*3/uL (ref 150–400)
Lab: 38 % — ABNORMAL LOW (ref 40–50)
Lab: 4.2 M/UL — ABNORMAL LOW (ref 4.4–5.5)
Lab: 7.2 FL (ref 7–11)
Lab: 89 FL (ref 80–100)
Lab: 11 K/UL — ABNORMAL HIGH (ref >40)
Lab: 4 M/UL — ABNORMAL LOW (ref <100)

## 2018-02-22 LAB — POC GLUCOSE
Lab: 181 mg/dL — ABNORMAL HIGH (ref 70–100)
Lab: 224 mg/dL — ABNORMAL HIGH (ref 70–100)
Lab: 271 mg/dL — ABNORMAL HIGH (ref 70–100)

## 2018-02-22 LAB — BASIC METABOLIC PANEL
Lab: 139 MMOL/L — ABNORMAL LOW (ref 137–147)
Lab: 4.3 MMOL/L — ABNORMAL LOW (ref 3.5–5.1)

## 2018-02-22 MED ORDER — IMS MIXTURE TEMPLATE
75 mg | Freq: Every day | ORAL | 0 refills | Status: DC
Start: 2018-02-22 — End: 2018-02-25
  Administered 2018-02-22 – 2018-02-25 (×8): 75 mg via ORAL

## 2018-02-22 MED ORDER — INSULIN ASPART 100 UNIT/ML SC FLEXPEN
0-6 [IU] | Freq: Before meals | SUBCUTANEOUS | 0 refills | Status: DC
Start: 2018-02-22 — End: 2018-02-25
  Administered 2018-02-22: 19:00:00 3 [IU] via SUBCUTANEOUS

## 2018-02-23 ENCOUNTER — Encounter: Admit: 2018-02-23 | Discharge: 2018-02-23

## 2018-02-23 DIAGNOSIS — I10 Essential (primary) hypertension: Principal | ICD-10-CM

## 2018-02-23 DIAGNOSIS — J869 Pyothorax without fistula: ICD-10-CM

## 2018-02-23 DIAGNOSIS — E785 Hyperlipidemia, unspecified: ICD-10-CM

## 2018-02-23 DIAGNOSIS — J189 Pneumonia, unspecified organism: ICD-10-CM

## 2018-02-23 DIAGNOSIS — E119 Type 2 diabetes mellitus without complications: ICD-10-CM

## 2018-02-23 DIAGNOSIS — M1990 Unspecified osteoarthritis, unspecified site: ICD-10-CM

## 2018-02-23 DIAGNOSIS — M48061 Spinal stenosis, lumbar region without neurogenic claudication: ICD-10-CM

## 2018-02-23 LAB — CBC: Lab: 12 K/UL — ABNORMAL HIGH (ref >60)

## 2018-02-23 LAB — POC GLUCOSE
Lab: 233 mg/dL — ABNORMAL HIGH (ref 70–100)
Lab: 208 mg/dL — ABNORMAL HIGH (ref 70–100)
Lab: 216 mg/dL — ABNORMAL HIGH (ref 70–100)
Lab: 176 mg/dL — ABNORMAL HIGH (ref 70–100)

## 2018-02-23 LAB — BASIC METABOLIC PANEL: Lab: 138 MMOL/L — ABNORMAL LOW (ref >60)

## 2018-02-23 MED ORDER — METHYLPREDNISOLONE 4 MG PO TAB
8 mg | Freq: Every evening | ORAL | 0 refills | Status: CP
Start: 2018-02-23 — End: ?
  Administered 2018-02-25: 04:00:00 8 mg via ORAL

## 2018-02-23 MED ORDER — METHYLPREDNISOLONE 4 MG PO TAB
4 mg | Freq: Two times a day (BID) | ORAL | 0 refills | Status: CP
Start: 2018-02-23 — End: ?
  Administered 2018-02-23 (×2): 4 mg via ORAL

## 2018-02-23 MED ORDER — SENNOSIDES 8.6 MG PO TAB
2 | Freq: Two times a day (BID) | ORAL | 0 refills | Status: DC
Start: 2018-02-23 — End: 2018-02-25
  Administered 2018-02-23 – 2018-02-25 (×3): 2 via ORAL

## 2018-02-23 MED ORDER — INSULIN GLARGINE 100 UNIT/ML (3 ML) SC INJ PEN
30 [IU] | Freq: Two times a day (BID) | SUBCUTANEOUS | 0 refills | Status: DC
Start: 2018-02-23 — End: 2018-02-25
  Administered 2018-02-23: 18:00:00 30 [IU] via SUBCUTANEOUS

## 2018-02-23 MED ORDER — METHYLPREDNISOLONE 4 MG PO TAB
8 mg | Freq: Two times a day (BID) | ORAL | 0 refills | Status: CP
Start: 2018-02-23 — End: ?
  Administered 2018-02-23 – 2018-02-24 (×2): 8 mg via ORAL

## 2018-02-23 MED ORDER — INSULIN ASPART 100 UNIT/ML SC FLEXPEN
30 [IU] | Freq: Three times a day (TID) | SUBCUTANEOUS | 0 refills | Status: DC
Start: 2018-02-23 — End: 2018-02-25

## 2018-02-23 MED ORDER — INSULIN LISPRO 100 UNIT/ML SC SOLN
30 [IU] | Freq: Three times a day (TID) | SUBCUTANEOUS | 0 refills | Status: DC
Start: 2018-02-23 — End: 2018-02-23

## 2018-02-23 MED ORDER — METHYLPREDNISOLONE 4 MG PO TAB
4 mg | Freq: Every evening | ORAL | 0 refills | Status: DC
Start: 2018-02-23 — End: 2018-02-25

## 2018-02-23 MED ORDER — POLYETHYLENE GLYCOL 3350 17 GRAM PO PWPK
2 | Freq: Two times a day (BID) | ORAL | 0 refills | Status: DC
Start: 2018-02-23 — End: 2018-02-23

## 2018-02-23 MED ORDER — METHYLPREDNISOLONE 4 MG PO TAB
4 mg | Freq: Three times a day (TID) | ORAL | 0 refills | Status: CP
Start: 2018-02-23 — End: ?
  Administered 2018-02-24 – 2018-02-25 (×3): 4 mg via ORAL

## 2018-02-23 MED ORDER — METHYLPREDNISOLONE 4 MG PO TAB
4 mg | Freq: Four times a day (QID) | ORAL | 0 refills | Status: DC
Start: 2018-02-23 — End: 2018-02-25
  Administered 2018-02-25: 15:00:00 4 mg via ORAL

## 2018-02-23 MED ORDER — BACITRACIN ZINC 500 UNIT/GRAM TP OINT
Freq: Two times a day (BID) | TOPICAL | 0 refills | Status: DC
Start: 2018-02-23 — End: 2018-02-25
  Administered 2018-02-23 – 2018-02-25 (×2): via TOPICAL

## 2018-02-23 MED ORDER — POLYETHYLENE GLYCOL 3350 17 GRAM PO PWPK
2 | Freq: Two times a day (BID) | ORAL | 0 refills | Status: DC
Start: 2018-02-23 — End: 2018-02-25
  Administered 2018-02-23: 18:00:00 17 g via ORAL
  Administered 2018-02-24 (×2): 34 g via ORAL

## 2018-02-23 MED ORDER — METHYLPREDNISOLONE 4 MG PO TAB
4 mg | Freq: Two times a day (BID) | ORAL | 0 refills | Status: DC
Start: 2018-02-23 — End: 2018-02-25

## 2018-02-23 MED ORDER — BISACODYL 10 MG RE SUPP
10 mg | Freq: Every day | RECTAL | 0 refills | Status: DC
Start: 2018-02-23 — End: 2018-02-25
  Administered 2018-02-23: 23:00:00 10 mg via RECTAL

## 2018-02-23 MED ORDER — METHYLPREDNISOLONE 4 MG PO TAB
4 mg | Freq: Three times a day (TID) | ORAL | 0 refills | Status: DC
Start: 2018-02-23 — End: 2018-02-25

## 2018-02-23 MED ORDER — MAGNESIUM HYDROXIDE 2,400 MG/10 ML PO SUSP
20 mL | Freq: Two times a day (BID) | ORAL | 0 refills | Status: DC
Start: 2018-02-23 — End: 2018-02-25
  Administered 2018-02-24: 04:00:00 20 mL via ORAL

## 2018-02-24 LAB — POC GLUCOSE
Lab: 118 mg/dL — ABNORMAL HIGH (ref 70–100)
Lab: 196 mg/dL — ABNORMAL HIGH (ref >60)
Lab: 224 mg/dL — ABNORMAL HIGH (ref 70–100)
Lab: 248 mg/dL — ABNORMAL HIGH (ref 70–100)

## 2018-02-24 LAB — BASIC METABOLIC PANEL: Lab: 139 MMOL/L — ABNORMAL LOW (ref >60)

## 2018-02-24 LAB — CBC: Lab: 12 K/UL — ABNORMAL HIGH (ref 4.5–11.0)

## 2018-02-24 MED ORDER — METHYLPREDNISOLONE 4 MG PO DSPK
0 refills | Status: CN
Start: 2018-02-24 — End: ?

## 2018-02-24 MED ORDER — METHYLPREDNISOLONE 4 MG PO DSPK
ORAL_TABLET | 0 refills | Status: CN
Start: 2018-02-24 — End: ?

## 2018-02-25 ENCOUNTER — Ambulatory Visit: Admit: 2018-02-21 | Discharge: 2018-02-21

## 2018-02-25 ENCOUNTER — Inpatient Hospital Stay: Admit: 2018-02-21 | Discharge: 2018-02-21

## 2018-02-25 ENCOUNTER — Encounter: Admit: 2018-02-25 | Discharge: 2018-02-25

## 2018-02-25 DIAGNOSIS — E785 Hyperlipidemia, unspecified: ICD-10-CM

## 2018-02-25 DIAGNOSIS — I10 Essential (primary) hypertension: ICD-10-CM

## 2018-02-25 DIAGNOSIS — M48062 Spinal stenosis, lumbar region with neurogenic claudication: Principal | ICD-10-CM

## 2018-02-25 DIAGNOSIS — M4316 Spondylolisthesis, lumbar region: ICD-10-CM

## 2018-02-25 DIAGNOSIS — E119 Type 2 diabetes mellitus without complications: ICD-10-CM

## 2018-02-25 LAB — POC GLUCOSE
Lab: 222 mg/dL — ABNORMAL HIGH (ref 70–100)
Lab: 222 mg/dL — ABNORMAL HIGH (ref 70–100)

## 2018-02-25 LAB — BASIC METABOLIC PANEL: Lab: 140 MMOL/L — ABNORMAL LOW (ref >60)

## 2018-02-25 LAB — CBC: Lab: 12 K/UL — ABNORMAL HIGH (ref 4.5–11.0)

## 2018-02-25 MED ORDER — METHYLPREDNISOLONE 4 MG PO DSPK
ORAL_TABLET | 0 refills | Status: AC
Start: 2018-02-25 — End: ?
  Filled 2018-02-25 (×2): qty 21, 6d supply, fill #1

## 2018-02-25 MED ORDER — OXYCODONE 5 MG PO TAB
5-15 mg | ORAL_TABLET | ORAL | 0 refills | 6.00000 days | Status: AC | PRN
Start: 2018-02-25 — End: ?
  Filled 2018-02-25 (×2): qty 150, 13d supply, fill #1

## 2018-02-25 MED ORDER — HYDROCODONE-ACETAMINOPHEN 10-325 MG PO TAB
1 | ORAL_TABLET | ORAL | 0 refills | 15.00000 days | Status: AC | PRN
Start: 2018-02-25 — End: 2018-02-25

## 2018-02-25 MED ORDER — HYDROCODONE-ACETAMINOPHEN 10-325 MG PO TAB
1 | ORAL_TABLET | ORAL | 0 refills | 30.00000 days | Status: AC | PRN
Start: 2018-02-25 — End: 2018-02-25

## 2018-02-26 ENCOUNTER — Encounter: Admit: 2018-02-26 | Discharge: 2018-02-26

## 2018-03-06 ENCOUNTER — Encounter: Admit: 2018-03-06 | Discharge: 2018-03-06

## 2018-03-06 MED ORDER — DICLOFENAC SODIUM 1 % TP GEL
4 g | Freq: Four times a day (QID) | TOPICAL | 3 refills | 19.00000 days | Status: AC
Start: 2018-03-06 — End: ?

## 2018-03-15 ENCOUNTER — Encounter: Admit: 2018-03-15 | Discharge: 2018-03-15

## 2018-03-15 DIAGNOSIS — M4316 Spondylolisthesis, lumbar region: Secondary | ICD-10-CM

## 2018-03-15 DIAGNOSIS — M48062 Spinal stenosis, lumbar region with neurogenic claudication: Secondary | ICD-10-CM

## 2018-03-16 ENCOUNTER — Ambulatory Visit: Admit: 2018-03-16 | Discharge: 2018-03-16

## 2018-03-16 ENCOUNTER — Encounter: Admit: 2018-03-16 | Discharge: 2018-03-16

## 2018-03-16 ENCOUNTER — Ambulatory Visit: Admit: 2018-03-16 | Discharge: 2018-03-16 | Payer: Commercial Managed Care - PPO

## 2018-03-16 DIAGNOSIS — M1990 Unspecified osteoarthritis, unspecified site: Secondary | ICD-10-CM

## 2018-03-16 DIAGNOSIS — M4316 Spondylolisthesis, lumbar region: Secondary | ICD-10-CM

## 2018-03-16 DIAGNOSIS — J869 Pyothorax without fistula: Secondary | ICD-10-CM

## 2018-03-16 DIAGNOSIS — M48061 Spinal stenosis, lumbar region without neurogenic claudication: Secondary | ICD-10-CM

## 2018-03-16 DIAGNOSIS — E785 Hyperlipidemia, unspecified: Secondary | ICD-10-CM

## 2018-03-16 DIAGNOSIS — M48062 Spinal stenosis, lumbar region with neurogenic claudication: Secondary | ICD-10-CM

## 2018-03-16 DIAGNOSIS — I10 Essential (primary) hypertension: Secondary | ICD-10-CM

## 2018-03-16 DIAGNOSIS — J189 Pneumonia, unspecified organism: Secondary | ICD-10-CM

## 2018-03-16 DIAGNOSIS — E119 Type 2 diabetes mellitus without complications: Secondary | ICD-10-CM

## 2018-03-16 MED ORDER — CEPHALEXIN 500 MG PO CAP
500 mg | ORAL_CAPSULE | Freq: Four times a day (QID) | ORAL | 0 refills | Status: AC
Start: 2018-03-16 — End: ?
  Filled 2018-03-16 (×2): qty 28, 7d supply, fill #1

## 2018-03-21 ENCOUNTER — Encounter: Admit: 2018-03-21 | Discharge: 2018-03-21

## 2018-03-21 MED ORDER — HYDROCODONE-ACETAMINOPHEN 5-325 MG PO TAB
1 | ORAL_TABLET | ORAL | 0 refills | 30.00000 days | Status: AC | PRN
Start: 2018-03-21 — End: 2018-03-21

## 2018-03-21 MED ORDER — HYDROCODONE-ACETAMINOPHEN 5-325 MG PO TAB
1 | ORAL_TABLET | ORAL | 0 refills | 15.00000 days | Status: AC | PRN
Start: 2018-03-21 — End: 2018-05-24

## 2018-04-05 ENCOUNTER — Encounter: Admit: 2018-04-05 | Discharge: 2018-04-05

## 2018-04-05 DIAGNOSIS — M48062 Spinal stenosis, lumbar region with neurogenic claudication: Secondary | ICD-10-CM

## 2018-04-05 DIAGNOSIS — M5416 Radiculopathy, lumbar region: Secondary | ICD-10-CM

## 2018-04-13 ENCOUNTER — Encounter: Admit: 2018-04-13 | Discharge: 2018-04-13

## 2018-04-13 ENCOUNTER — Ambulatory Visit: Admit: 2018-04-13 | Discharge: 2018-04-13

## 2018-04-13 ENCOUNTER — Ambulatory Visit: Admit: 2018-04-13 | Discharge: 2018-04-13 | Payer: Commercial Managed Care - PPO

## 2018-04-13 DIAGNOSIS — M48062 Spinal stenosis, lumbar region with neurogenic claudication: Principal | ICD-10-CM

## 2018-04-13 DIAGNOSIS — M48061 Spinal stenosis, lumbar region without neurogenic claudication: ICD-10-CM

## 2018-04-13 DIAGNOSIS — J869 Pyothorax without fistula: ICD-10-CM

## 2018-04-13 DIAGNOSIS — J189 Pneumonia, unspecified organism: ICD-10-CM

## 2018-04-13 DIAGNOSIS — E785 Hyperlipidemia, unspecified: ICD-10-CM

## 2018-04-13 DIAGNOSIS — E119 Type 2 diabetes mellitus without complications: ICD-10-CM

## 2018-04-13 DIAGNOSIS — M5416 Radiculopathy, lumbar region: ICD-10-CM

## 2018-04-13 DIAGNOSIS — I10 Essential (primary) hypertension: Principal | ICD-10-CM

## 2018-04-13 DIAGNOSIS — M1990 Unspecified osteoarthritis, unspecified site: ICD-10-CM

## 2018-05-18 ENCOUNTER — Encounter: Admit: 2018-05-18 | Discharge: 2018-05-18

## 2018-05-18 DIAGNOSIS — M5416 Radiculopathy, lumbar region: Principal | ICD-10-CM

## 2018-05-18 DIAGNOSIS — M48062 Spinal stenosis, lumbar region with neurogenic claudication: ICD-10-CM

## 2018-05-23 ENCOUNTER — Encounter: Admit: 2018-05-23 | Discharge: 2018-05-23

## 2018-05-24 MED ORDER — HYDROCODONE-ACETAMINOPHEN 5-325 MG PO TAB
1 | ORAL_TABLET | ORAL | 0 refills | 15.00000 days | Status: AC | PRN
Start: 2018-05-24 — End: ?

## 2018-05-31 ENCOUNTER — Encounter: Admit: 2018-05-31 | Discharge: 2018-05-31

## 2018-05-31 MED ORDER — BACLOFEN 10 MG PO TAB
10 mg | ORAL_TABLET | Freq: Two times a day (BID) | ORAL | 3 refills | Status: AC
Start: 2018-05-31 — End: ?

## 2018-05-31 NOTE — Telephone Encounter
Pharmacy called requesting Baclofen refill for patient.    Patient post TLIF with Dr. Jean Rosenthal on 04/13/2018. Dr. Jean Rosenthal prescribing hydrocodone.    Last office visit 10/31/2017.

## 2019-04-30 ENCOUNTER — Encounter: Admit: 2019-04-30 | Discharge: 2019-04-30

## 2019-04-30 NOTE — Telephone Encounter
Ray Walls called asking to have Dr. Samara Deist give him a call to discuss if injections would be beneficial. RN advised could schedule office visit, but Ray Walls asked if he could just talk to Dr. Samara Deist first as he has spent thousands of dollars on back surgery and office visits that ended up not being helpful to relieve his pain.    Advised would send request to Dr. Samara Deist and get back to him. Ray Walls was appreciative of the call.

## 2019-06-22 IMAGING — CT Neck^1_CT_SOFT_TISSUE_NECK_5MM_WITH (Adult)
1 series · 12 of 14 positions shown, 15 images · IV contrast (APPLIED)
Comparison: none

PROCEDURE: NECK 1_CT_SOFT_TISSUE_NECK_5MM_WITH (ADULT)
HISTORY: RED, SWOLLEN LUMP NEAR BASE OF SKULL, POSTERIOR TO RT EAR. STATES
LUMP IS TENDER AND WARM TO TOUCH. HAS NECK PAIN THAT RADIATES TO RT SIDE
HEAD AND RT SHOULDER. H/O PRIOR STAPH INFECTION TO CHEST. CREAT.-1.56, CRP-TS.
PER DR. TSUMURA. PT GIVEN 50 CC OMNI 300 AFTER 1L IV SALINE IN ER.
TECHNIQUE: Axial CT imaging of the neck was performed with contrast using soft tissue window
imaging.

[Series 5: st neck 5.0 soft tissue · axial · 0.43mm/px · z∈[+72,+256]mm · 12 of 45 slices shown, 15 images]
[im 4/45  soft-tissue]
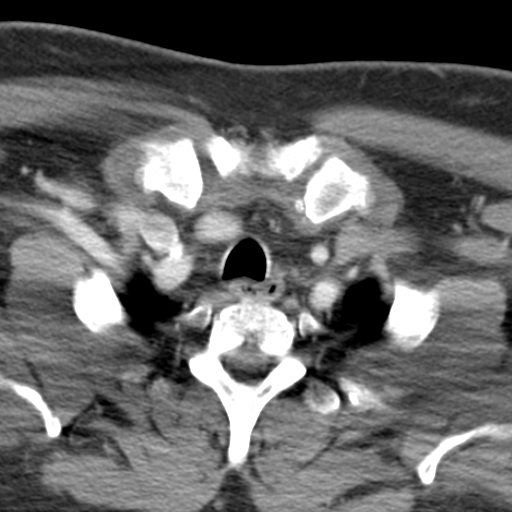
[im 4/45  bone]
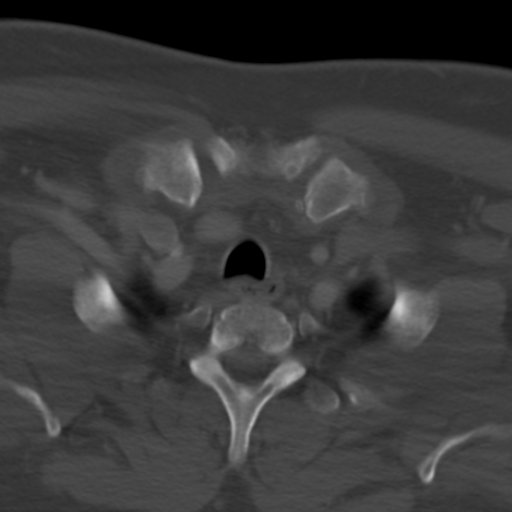
[im 7/45  bone]
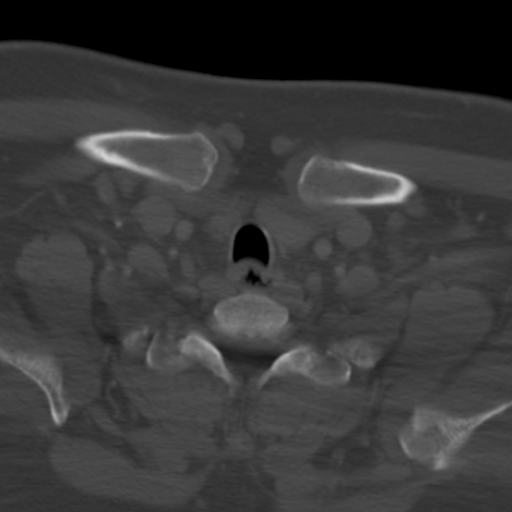
[im 11/45  bone]
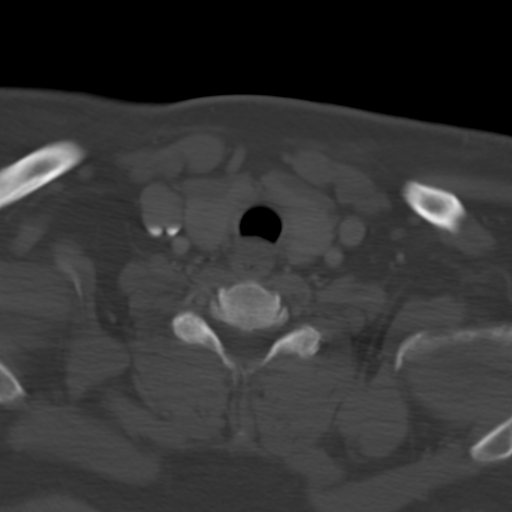
[im 14/45  bone]
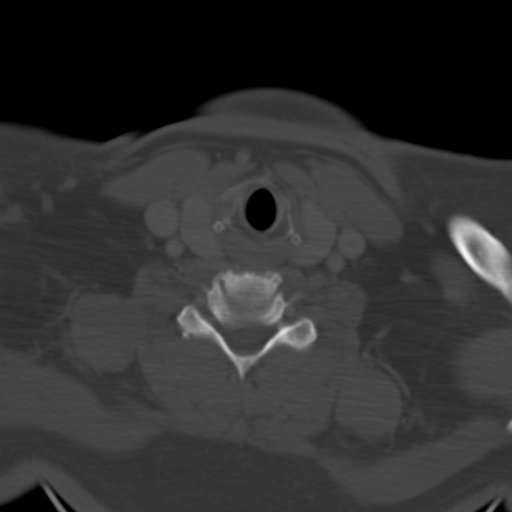
[im 17/45  soft-tissue]
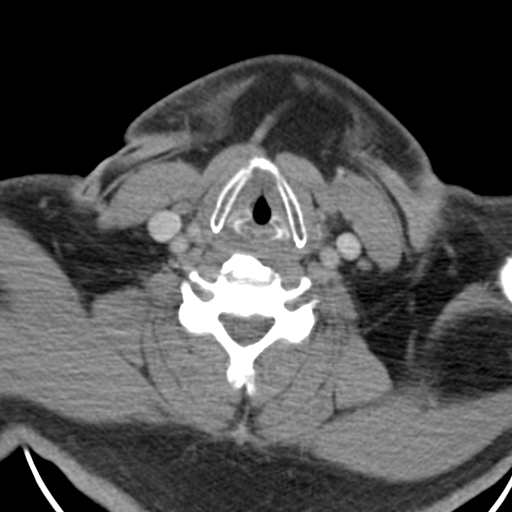
[im 17/45  bone]
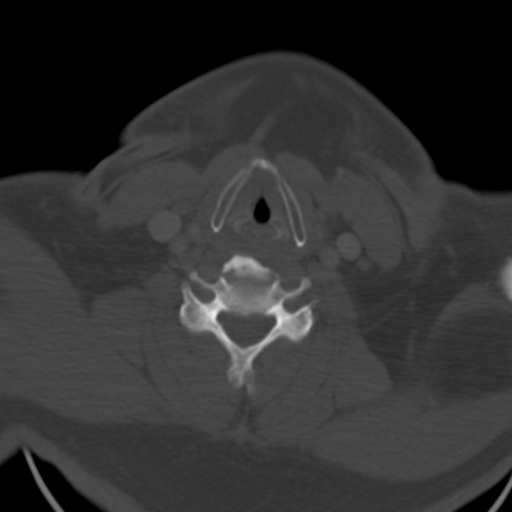
[im 21/45  bone]
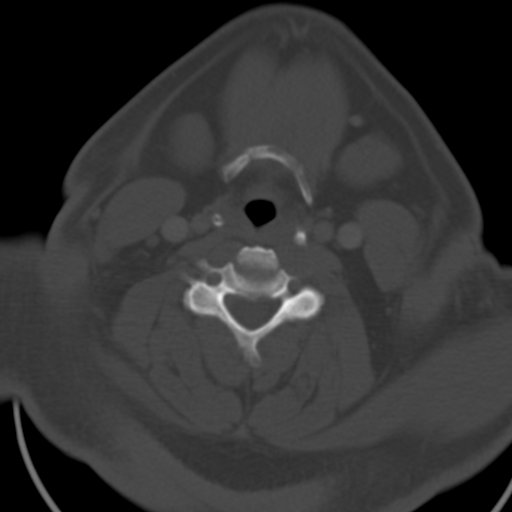
[im 24/45  bone]
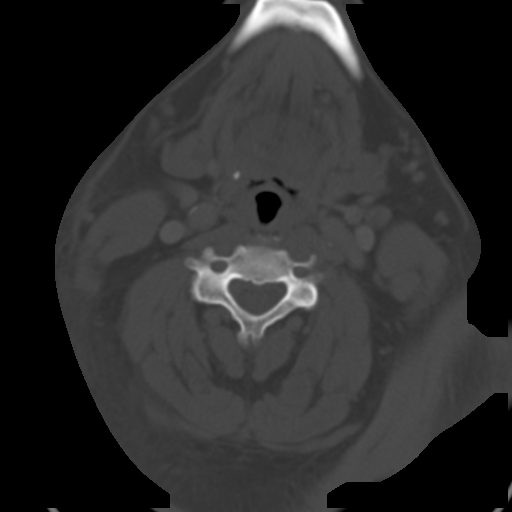
[im 28/45  bone]
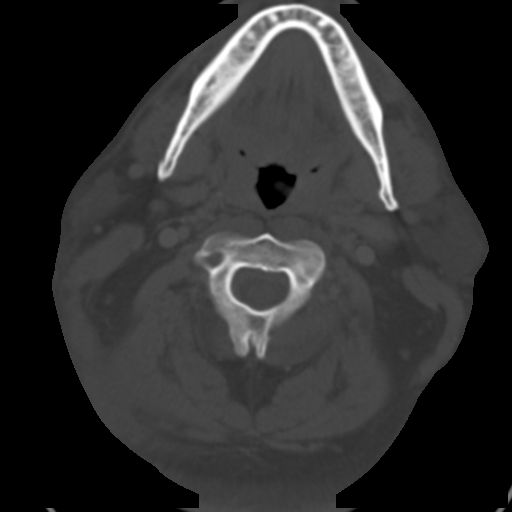
[im 31/45  soft-tissue]
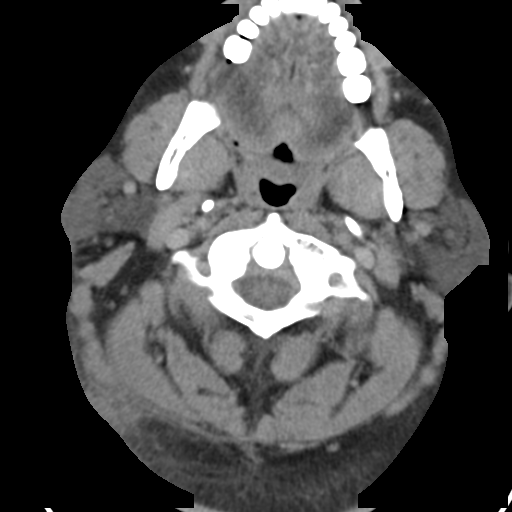
[im 31/45  bone]
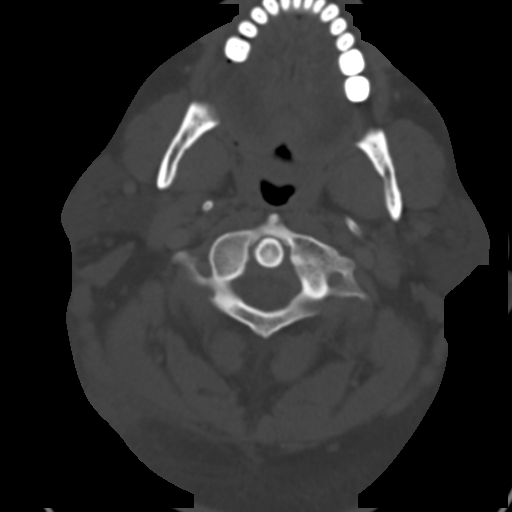
[im 34/45  bone]
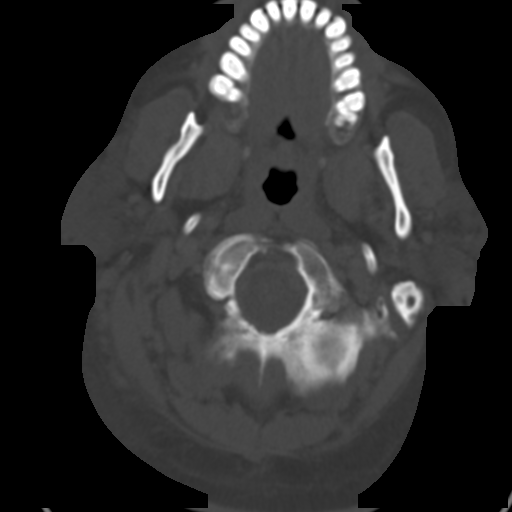
[im 38/45  bone]
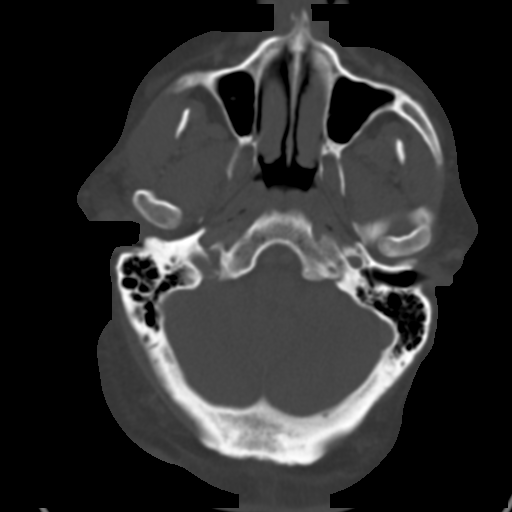
[im 41/45  bone]
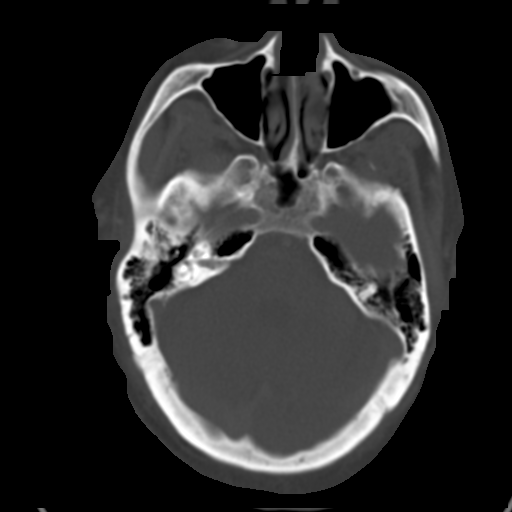

[12 of 14 positions shown; findings below may reference images not displayed]

FINDINGS: The large airway and great vessels of the neck appear within normal limits. The vocal
folds are symmetric in position. The periepiglottic fat is clean with the epiglottis being normal
in size.
There is no thickening of the aeriepiglottic folds. The parapharyngeal fat is clean and
nondisplaced. I
don't see any soft tissue mass or lymphadenopathy in the neck. The right parotid gland shows mild
enlargement with increased enhancement after contrast administration. The submandibular glands
are symmetric. No enhancing fluid collections are seen to indicate abscess formation. The visualized
portions of the lung apices are clear. The cervical spine shows mild degenerative changes. Mild
phlegmonous soft tissue stranding is seen within the subcutaneous tissues of the posterior neck on
the right. No definite abscess is seen.
IMPRESSION: 1. Mild parotitis is seen of the right parotid gland with enlargement and mild enhancement..
2. No lymphadenopathy is seen.
3. Mild probable cellulitis in the posterior lateral soft tissues of the neck. No abscess is seen.

ADDENDUM

## 2021-08-10 ENCOUNTER — Ambulatory Visit: Admit: 2021-08-10 | Discharge: 2021-08-10 | Payer: BC Managed Care – PPO

## 2021-08-10 ENCOUNTER — Encounter: Admit: 2021-08-10 | Discharge: 2021-08-10 | Payer: BC Managed Care – PPO

## 2021-08-10 DIAGNOSIS — M5116 Intervertebral disc disorders with radiculopathy, lumbar region: Secondary | ICD-10-CM

## 2021-08-10 DIAGNOSIS — M5416 Radiculopathy, lumbar region: Secondary | ICD-10-CM

## 2021-08-10 MED ORDER — HYDROCODONE-ACETAMINOPHEN 10-325 MG PO TAB
1 | ORAL_TABLET | Freq: Two times a day (BID) | ORAL | 0 refills | 30.00000 days | Status: AC | PRN
Start: 2021-08-10 — End: ?
  Filled 2021-08-10: qty 60, 30d supply, fill #1

## 2021-08-10 MED ORDER — NALOXONE 4 MG/ACTUATION NA SPRY
NASAL | 1 refills | 1.00000 days | Status: AC
Start: 2021-08-10 — End: ?

## 2021-08-10 MED ORDER — HYDROCODONE-ACETAMINOPHEN 10-325 MG PO TAB
1 | ORAL_TABLET | Freq: Two times a day (BID) | ORAL | 0 refills | 30.00000 days | Status: DC | PRN
Start: 2021-08-10 — End: 2021-08-10

## 2021-08-10 NOTE — Progress Notes
[facility] CLINIC NOTE       SUBJECTIVE: Mr Yebra presents for ongoing care regarding bilateral lumbar radiculopathy.  He has done well since lumbar decompression surgery performed by Dr. Jolayne Haines approximately 2 years ago.  Walking and standing exacerbates his pain.  Pain is improved with rest.  For pain relief he is taking hydrocodone 10-325 mg approximately twice per day.  Walking and standing exacerbates pain.  Pain is improved with rest.  He is performing a home exercise plan as prescribed by physical therapy for core strengthening.  His pain has become exacerbated is in the bilateral lateral hips and in the right lumbosacral region extending to the right thigh and calf.         Review of Systems    Current Outpatient Medications:   ?  acetaminophen (TYLENOL) 500 mg tablet, Take 1,000 mg by mouth every 6 hours as needed for Pain. Max of 4,000 mg of acetaminophen in 24 hours., Disp: , Rfl:   ?  aspirin EC 81 mg tablet, Take 81 mg by mouth daily., Disp: , Rfl:   ?  atorvastatin (LIPITOR) 40 mg tablet, Take 40 mg by mouth daily., Disp: , Rfl:   ?  baclofen (LIORESAL) 10 mg tablet, Take one tablet by mouth twice daily., Disp: 60 tablet, Rfl: 3  ?  Blood-Glucose Meter (ONE TOUCH ULTRAMINI) kit, Use to check blood glucose., Disp: 2 Each, Rfl: 0  ?  cephalexin (KEFLEX) 500 mg capsule, Take one capsule by mouth four times daily., Disp: 28 capsule, Rfl: 0  ?  diclofenac (VOLTAREN) 1 % topical gel, Apply four g topically to affected area four times daily. Apply to affected area.  Measure dose of gel with dosing card (provided)., Disp: 500 g, Rfl: 3  ?  docusate (COLACE) 100 mg capsule, Take 300 mg by mouth daily., Disp: , Rfl:   ?  fenofibrate nanocrystallized (LOFIBRA) 160 mg tablet, Take 160 mg by mouth daily., Disp: , Rfl:   ?  gabapentin (NEURONTIN) 800 mg tablet, Take one tablet by mouth every 8 hours., Disp: 270 tablet, Rfl: 3  ?  GLUC SU/CHONDRO SU A/VIT C/MN (GLUCOSAMINE 1500 COMPLEX PO), Take 1 tablet by mouth daily as needed., Disp: , Rfl:   ?  hydroCHLOROthiazide (HYDRODIURIL) 12.5 mg tablet, Take 12.5 mg by mouth every morning., Disp: , Rfl:   ?  HYDROcodone/acetaminophen (NORCO) 10/325 mg tablet, Take one tablet by mouth every 12 hours as needed for Pain., Disp: 60 tablet, Rfl: 0  ?  HYDROcodone/acetaminophen (NORCO) 5/325 mg tablet, Take one tablet by mouth every 8 hours as needed for Pain  Max 8 tabs/day, Disp: 50 tablet, Rfl: 0  ?  insulin detemir U-100(+) (LEVEMIR) 100 unit/mL vial, Inject 30 Units under the skin twice daily., Disp: , Rfl:   ?  insulin lispro(+) (HUMALOG KWIKPEN INSULIN) 100 unit/mL injection PEN, Inject 30 Units under the skin three times daily., Disp: , Rfl:   ?  LORazepam (ATIVAN) 1 mg tablet, Take 1 mg by mouth daily as needed., Disp: , Rfl:   ?  losartan (COZAAR) 50 mg tablet, Take 50 mg by mouth daily., Disp: , Rfl:   ?  metFORMIN (GLUCOPHAGE) 1,000 mg tablet, Take 1,000 mg by mouth twice daily with meals., Disp: , Rfl:   ?  methylPREDNIsolone (MEDROL DOSEPAK) 4 mg tablet, Take medication as directed on package for 6 days. Take with food., Disp: 21 tablet, Rfl: 0  ?  metoprolol XL (TOPROL XL) 50 mg tablet,  Take 75 mg by mouth daily., Disp: , Rfl:   ?  naloxone (NARCAN) 4 mg/actuation nasal spray, Insert 1 spray into 1 nostril as needed for signs of opioid overdose then call 911. May repeat dose every 2-3 minutes (alternate nostrils) until medical team arrives.  Indications: opioid overdose, Disp: 2 each, Rfl: 1  ?  ondansetron (ZOFRAN ODT) 8 mg rapid dissolve tablet, Dissolve 8 mg by mouth every 8 hours as needed., Disp: , Rfl:   ?  oxyCODONE (ROXICODONE) 5 mg tablet, Take one tablet to three tablets by mouth every 3 hours as needed, Disp: 150 tablet, Rfl: 0  ?  senna/docusate (SENOKOT-S) 8.6/50 mg tablet, Take 1 tablet by mouth three times daily., Disp: , Rfl:   ?  sildenafil(+) (VIAGRA) 50 mg tablet, Take 1 Tab by mouth as Needed for Erectile dysfunction., Disp: 10 Tab, Rfl: 0  Allergies   Allergen Reactions   ? Pcn [Penicillins] ANAPHYLAXIS     Physical Exam  Vitals:    08/10/21 1552   BP: (!) (P) 143/76   SpO2: (P) 98%   PainSc: Seven   Weight: 120.2 kg (265 lb)   Height: 182.9 cm (6')        Pain Score: Seven  Body mass index is 35.94 kg/m?Marland Kitchen  General: Alert and oriented, very pleasant male.   HEENT showed extraocular muscles were intact and no other abnormalities.  Unlabored breathing.  Regular rate and rhythm on CV exam.   5/5 strength in bilateral upper and lower extremities.    Sensation is intact to light touch and equal in the upper and lower extremities.  There is bilateral lumbar tenderness to palpation           IMPRESSION:  1. Lumbar radicular pain    2. Lumbar disc disease with radiculopathy          PLAN: We will continue with home exercise plan as prescribed by physical therapy.  I am prescribing hydrocodone 10-325 mg twice per day and consider repeat spinal injections.

## 2021-10-30 ENCOUNTER — Encounter: Admit: 2021-10-30 | Discharge: 2021-10-30 | Payer: BC Managed Care – PPO

## 2021-10-30 NOTE — Telephone Encounter
Patient called asking for injection. He was seen by Dr. Samara Deist in clinic as a new patient on 08/10/21 with no mention of injection. Routing to provider for review.

## 2021-11-02 ENCOUNTER — Encounter: Admit: 2021-11-02 | Discharge: 2021-11-02 | Payer: BC Managed Care – PPO

## 2021-11-03 ENCOUNTER — Encounter: Admit: 2021-11-03 | Discharge: 2021-11-03 | Payer: BC Managed Care – PPO

## 2021-11-03 MED ORDER — HYDROCODONE-ACETAMINOPHEN 10-325 MG PO TAB
1 | ORAL_TABLET | Freq: Two times a day (BID) | ORAL | 0 refills | PRN
Start: 2021-11-03 — End: ?

## 2021-11-03 NOTE — Telephone Encounter
Patient requesting refill of hydrocodone  Last Office Visit 08/10/21  Next Office Visit 11/12/21  Required labs NA  [facility] last filled 08/10/21

## 2021-11-12 ENCOUNTER — Encounter: Admit: 2021-11-12 | Discharge: 2021-11-12 | Payer: BC Managed Care – PPO

## 2021-11-17 ENCOUNTER — Encounter: Admit: 2021-11-17 | Discharge: 2021-11-17 | Payer: BC Managed Care – PPO

## 2021-11-17 DIAGNOSIS — E119 Type 2 diabetes mellitus without complications: Secondary | ICD-10-CM

## 2021-11-17 DIAGNOSIS — E785 Hyperlipidemia, unspecified: Secondary | ICD-10-CM

## 2021-11-17 DIAGNOSIS — M1990 Unspecified osteoarthritis, unspecified site: Secondary | ICD-10-CM

## 2021-11-17 DIAGNOSIS — J189 Pneumonia, unspecified organism: Secondary | ICD-10-CM

## 2021-11-17 DIAGNOSIS — J869 Pyothorax without fistula: Secondary | ICD-10-CM

## 2021-11-17 DIAGNOSIS — M48061 Spinal stenosis, lumbar region without neurogenic claudication: Secondary | ICD-10-CM

## 2021-11-17 DIAGNOSIS — I10 Essential (primary) hypertension: Secondary | ICD-10-CM

## 2021-11-18 ENCOUNTER — Ambulatory Visit: Admit: 2021-11-18 | Discharge: 2021-11-18 | Payer: BC Managed Care – PPO

## 2021-11-18 ENCOUNTER — Encounter: Admit: 2021-11-18 | Discharge: 2021-11-18 | Payer: BC Managed Care – PPO

## 2021-11-18 DIAGNOSIS — E785 Hyperlipidemia, unspecified: Secondary | ICD-10-CM

## 2021-11-18 DIAGNOSIS — E119 Type 2 diabetes mellitus without complications: Secondary | ICD-10-CM

## 2021-11-18 DIAGNOSIS — M48061 Spinal stenosis, lumbar region without neurogenic claudication: Secondary | ICD-10-CM

## 2021-11-18 DIAGNOSIS — J189 Pneumonia, unspecified organism: Secondary | ICD-10-CM

## 2021-11-18 DIAGNOSIS — I10 Essential (primary) hypertension: Secondary | ICD-10-CM

## 2021-11-18 DIAGNOSIS — M5116 Intervertebral disc disorders with radiculopathy, lumbar region: Secondary | ICD-10-CM

## 2021-11-18 DIAGNOSIS — M1990 Unspecified osteoarthritis, unspecified site: Secondary | ICD-10-CM

## 2021-11-18 DIAGNOSIS — J869 Pyothorax without fistula: Secondary | ICD-10-CM

## 2021-11-18 MED ORDER — IOHEXOL 240 MG IODINE/ML IV SOLN
1 mL | Freq: Once | EPIDURAL | 0 refills | Status: CP
Start: 2021-11-18 — End: ?
  Administered 2021-11-18: 15:00:00 1 mL via EPIDURAL

## 2021-11-18 MED ORDER — DEXAMETHASONE SODIUM PHOS (PF) 10 MG/ML IJ EPIDURAL SOLN
15 mg | Freq: Once | EPIDURAL | 0 refills | Status: CP
Start: 2021-11-18 — End: ?
  Administered 2021-11-18: 15:00:00 15 mg via EPIDURAL

## 2021-11-18 NOTE — Patient Instructions
Procedure Completed Today: Lumbar Transforaminal Steroid Injection    Important information following your procedure today: You may drive today    Pain relief may not be immediate. It is possible you may even experience an increase in pain during the first 24-48 hours followed by a gradual decrease of your pain.  Though the procedure is generally safe and complications are rare, we do ask that you be aware of any of the following:   Any swelling, persistent redness, new bleeding, or drainage from the site of the injection.  You should not experience a severe headache.  You should not run a fever over 101? F.  New onset of sharp, severe back & or neck pain.  New onset of upper or lower extremity numbness or weakness.  New difficulty controlling bowel or bladder function after the injection.  New shortness of breath.    If any of these occur, please call to report this occurrence to a nurse at [phone]. If you are calling after 4:00 p.m., on weekends or holidays please call [phone] and ask to have the resident physician on call for the physician paged or go to your local emergency room.  You may experience soreness at the injection site. Ice can be applied at 20 minute intervals. Avoid application of direct heat, hot showers or hot tubs today.  Avoid strenuous activity today. You may resume your regular activities and exercise tomorrow.  Patients with diabetes may see an elevation in blood sugars for 7-10 days after the injection. It is important to pay close attention to your diet, check your blood sugars daily and report extreme elevations to the physician that treats your diabetes.  Patients taking a daily blood thinner can resume their regular dose this evening.  It is important that you take all medications ordered by your pain physician. Taking medication as ordered is an important part of your pain care plan. If you cannot continue the medication plan, please notify the physician.     Possible side effects to steroids that may occur:  Flushing or redness of the face  Irritability  Fluid retention  Change in women?s menses    The following medications were used: Lidocaine , Decadron, and Contrast Dye

## 2021-11-18 NOTE — Progress Notes

## 2021-11-19 ENCOUNTER — Encounter: Admit: 2021-11-19 | Discharge: 2021-11-19 | Payer: BC Managed Care – PPO

## 2022-02-16 ENCOUNTER — Encounter: Admit: 2022-02-16 | Discharge: 2022-02-16 | Payer: BC Managed Care – PPO

## 2022-02-16 DIAGNOSIS — M5416 Radiculopathy, lumbar region: Secondary | ICD-10-CM

## 2022-02-17 ENCOUNTER — Ambulatory Visit: Admit: 2022-02-17 | Discharge: 2022-02-17 | Payer: BC Managed Care – PPO

## 2022-02-17 ENCOUNTER — Encounter: Admit: 2022-02-17 | Discharge: 2022-02-17 | Payer: BC Managed Care – PPO

## 2022-02-17 DIAGNOSIS — M48061 Spinal stenosis, lumbar region without neurogenic claudication: Secondary | ICD-10-CM

## 2022-02-17 DIAGNOSIS — I10 Essential (primary) hypertension: Secondary | ICD-10-CM

## 2022-02-17 DIAGNOSIS — E785 Hyperlipidemia, unspecified: Secondary | ICD-10-CM

## 2022-02-17 DIAGNOSIS — M5116 Intervertebral disc disorders with radiculopathy, lumbar region: Secondary | ICD-10-CM

## 2022-02-17 DIAGNOSIS — R519 Generalized headaches: Secondary | ICD-10-CM

## 2022-02-17 DIAGNOSIS — M1990 Unspecified osteoarthritis, unspecified site: Secondary | ICD-10-CM

## 2022-02-17 DIAGNOSIS — M5416 Radiculopathy, lumbar region: Secondary | ICD-10-CM

## 2022-02-17 DIAGNOSIS — J869 Pyothorax without fistula: Secondary | ICD-10-CM

## 2022-02-17 DIAGNOSIS — E119 Type 2 diabetes mellitus without complications: Secondary | ICD-10-CM

## 2022-02-17 DIAGNOSIS — J189 Pneumonia, unspecified organism: Secondary | ICD-10-CM

## 2022-02-17 DIAGNOSIS — M2550 Pain in unspecified joint: Secondary | ICD-10-CM

## 2022-02-17 MED ORDER — DEXAMETHASONE SODIUM PHOS (PF) 10 MG/ML IJ EPIDURAL SOLN
15 mg | Freq: Once | EPIDURAL | 0 refills | Status: DC
Start: 2022-02-17 — End: 2022-02-17

## 2022-02-17 MED ORDER — IOHEXOL 240 MG IODINE/ML IV SOLN
2.5 mL | Freq: Once | EPIDURAL | 0 refills | Status: CP
Start: 2022-02-17 — End: ?
  Administered 2022-02-17: 16:00:00 2.5 mL via EPIDURAL

## 2022-02-17 MED ORDER — IOHEXOL 240 MG IODINE/ML IV SOLN
2.5 mL | Freq: Once | EPIDURAL | 0 refills | Status: DC
Start: 2022-02-17 — End: 2022-02-17

## 2022-02-17 MED ORDER — TRIAMCINOLONE ACETONIDE 40 MG/ML IJ SUSP
80 mg | Freq: Once | EPIDURAL | 0 refills | Status: CP
Start: 2022-02-17 — End: ?
  Administered 2022-02-17: 16:00:00 80 mg via EPIDURAL

## 2022-02-17 NOTE — Progress Notes

## 2022-02-17 NOTE — Patient Instructions
Procedure Completed Today: Lumbar Transforaminal Steroid Injection    Important information following your procedure today: You may drive today    Pain relief may not be immediate. It is possible you may even experience an increase in pain during the first 24-48 hours followed by a gradual decrease of your pain.  Though the procedure is generally safe and complications are rare, we do ask that you be aware of any of the following:   Any swelling, persistent redness, new bleeding, or drainage from the site of the injection.  You should not experience a severe headache.  You should not run a fever over 101? F.  New onset of sharp, severe back & or neck pain.  New onset of upper or lower extremity numbness or weakness.  New difficulty controlling bowel or bladder function after the injection.  New shortness of breath.    If any of these occur, please call to report this occurrence to Dr. Samara Deist at [phone]. If you are calling after 4:00 p.m., on weekends or holidays please call [phone] and ask to have the resident physician on call for the physician paged or go to your local emergency room.  You may experience soreness at the injection site. Ice can be applied at 20 minute intervals. Avoid application of direct heat, hot showers or hot tubs today.  Avoid strenuous activity today. You may resume your regular activities and exercise tomorrow.  Patients with diabetes may see an elevation in blood sugars for 7-10 days after the injection. It is important to pay close attention to your diet, check your blood sugars daily and report extreme elevations to the physician that treats your diabetes.  Patients taking a daily blood thinner can resume their regular dose this evening.  It is important that you take all medications ordered by your pain physician. Taking medication as ordered is an important part of your pain care plan. If you cannot continue the medication plan, please notify the physician.     Possible side effects to steroids that may occur:  Flushing or redness of the face  Irritability  Fluid retention  Change in women?s menses    The following medications were used: Lidocaine , Triamcinolone  , and Contrast Dye

## 2022-02-18 ENCOUNTER — Encounter: Admit: 2022-02-18 | Discharge: 2022-02-18 | Payer: BC Managed Care – PPO

## 2022-12-10 ENCOUNTER — Encounter: Admit: 2022-12-10 | Discharge: 2022-12-10 | Payer: BC Managed Care – PPO

## 2022-12-24 ENCOUNTER — Encounter: Admit: 2022-12-24 | Discharge: 2022-12-24 | Payer: BC Managed Care – PPO

## 2023-08-12 ENCOUNTER — Encounter: Admit: 2023-08-12 | Discharge: 2023-08-12 | Payer: BLUE CROSS/BLUE SHIELD

## 2023-09-01 ENCOUNTER — Encounter: Admit: 2023-09-01 | Discharge: 2023-09-01 | Payer: BLUE CROSS/BLUE SHIELD

## 2023-09-21 ENCOUNTER — Encounter: Admit: 2023-09-21 | Discharge: 2023-09-21 | Payer: BLUE CROSS/BLUE SHIELD

## 2023-09-21 NOTE — Telephone Encounter
 Spoke with Ray Walls who had questions regarding upcoming appointment with Dr Redger. He is interested in IPP but needed information ahead of appointment. Provided him with dx and CPT codes so that he could check with his insurance regarding coverage. Also briefly discussed post-op restrictions so that he could touch base with his employer regarding time off. He will reach out if he would like to reschedule appt.

## 2023-10-12 ENCOUNTER — Encounter: Admit: 2023-10-12 | Discharge: 2023-10-12 | Payer: BLUE CROSS/BLUE SHIELD
